# Patient Record
Sex: Male | Born: 1975
Health system: Southern US, Community
[De-identification: ages and names within clinical notes are randomized; demographics above are authoritative.]

## PROBLEM LIST (undated history)

## (undated) DIAGNOSIS — K219 Gastro-esophageal reflux disease without esophagitis: Secondary | ICD-10-CM

## (undated) DIAGNOSIS — J189 Pneumonia, unspecified organism: Secondary | ICD-10-CM

## (undated) DIAGNOSIS — M2550 Pain in unspecified joint: Secondary | ICD-10-CM

## (undated) DIAGNOSIS — E785 Hyperlipidemia, unspecified: Secondary | ICD-10-CM

## (undated) DIAGNOSIS — R51 Headache: Secondary | ICD-10-CM

## (undated) DIAGNOSIS — T7840XA Allergy, unspecified, initial encounter: Secondary | ICD-10-CM

## (undated) DIAGNOSIS — R200 Anesthesia of skin: Secondary | ICD-10-CM

## (undated) DIAGNOSIS — G4700 Insomnia, unspecified: Secondary | ICD-10-CM

## (undated) HISTORY — PX: OTHER SURGICAL HISTORY: SHX169

## (undated) HISTORY — DX: Allergy, unspecified, initial encounter: T78.40XA

## (undated) HISTORY — DX: Hyperlipidemia, unspecified: E78.5

---

## 2008-11-10 ENCOUNTER — Emergency Department (HOSPITAL_COMMUNITY): Admission: EM | Admit: 2008-11-10 | Discharge: 2008-11-10 | Payer: Self-pay | Admitting: Emergency Medicine

## 2009-04-27 ENCOUNTER — Encounter (INDEPENDENT_AMBULATORY_CARE_PROVIDER_SITE_OTHER): Payer: Self-pay | Admitting: Family Medicine

## 2009-04-27 ENCOUNTER — Inpatient Hospital Stay (HOSPITAL_COMMUNITY): Admission: EM | Admit: 2009-04-27 | Discharge: 2009-04-29 | Payer: Self-pay | Admitting: Emergency Medicine

## 2009-04-29 ENCOUNTER — Encounter (INDEPENDENT_AMBULATORY_CARE_PROVIDER_SITE_OTHER): Payer: Self-pay | Admitting: Family Medicine

## 2009-05-21 ENCOUNTER — Ambulatory Visit: Payer: Self-pay | Admitting: Internal Medicine

## 2009-05-21 ENCOUNTER — Encounter (INDEPENDENT_AMBULATORY_CARE_PROVIDER_SITE_OTHER): Payer: Self-pay | Admitting: Family Medicine

## 2009-05-21 DIAGNOSIS — J45909 Unspecified asthma, uncomplicated: Secondary | ICD-10-CM

## 2009-05-21 DIAGNOSIS — R062 Wheezing: Secondary | ICD-10-CM

## 2009-05-21 DIAGNOSIS — J069 Acute upper respiratory infection, unspecified: Secondary | ICD-10-CM | POA: Insufficient documentation

## 2009-05-21 DIAGNOSIS — J309 Allergic rhinitis, unspecified: Secondary | ICD-10-CM | POA: Insufficient documentation

## 2009-05-21 LAB — CONVERTED CEMR LAB
AST: 16 units/L (ref 0–37)
Albumin: 4.8 g/dL (ref 3.5–5.2)
Alkaline Phosphatase: 62 units/L (ref 39–117)
BUN: 9 mg/dL (ref 6–23)
Basophils Absolute: 0.1 10*3/uL (ref 0.0–0.1)
Creatinine, Ser: 0.86 mg/dL (ref 0.40–1.50)
Eosinophils Relative: 12 % — ABNORMAL HIGH (ref 0–5)
HCT: 50.5 % (ref 39.0–52.0)
Lymphocytes Relative: 27 % (ref 12–46)
Neutrophils Relative %: 53 % (ref 43–77)
Platelets: 249 10*3/uL (ref 150–400)
Potassium: 4.3 meq/L (ref 3.5–5.3)
RDW: 12.6 % (ref 11.5–15.5)
Rapid HIV Screen: NEGATIVE

## 2009-06-06 ENCOUNTER — Ambulatory Visit: Payer: Self-pay | Admitting: Pulmonary Disease

## 2009-06-06 DIAGNOSIS — E785 Hyperlipidemia, unspecified: Secondary | ICD-10-CM | POA: Insufficient documentation

## 2009-06-06 DIAGNOSIS — D721 Eosinophilia: Secondary | ICD-10-CM

## 2009-06-17 ENCOUNTER — Ambulatory Visit: Payer: Self-pay | Admitting: Family Medicine

## 2009-07-09 ENCOUNTER — Encounter (INDEPENDENT_AMBULATORY_CARE_PROVIDER_SITE_OTHER): Payer: Self-pay | Admitting: Family Medicine

## 2009-07-15 ENCOUNTER — Ambulatory Visit: Payer: Self-pay | Admitting: Internal Medicine

## 2009-07-22 ENCOUNTER — Ambulatory Visit (HOSPITAL_COMMUNITY): Admission: RE | Admit: 2009-07-22 | Discharge: 2009-07-22 | Payer: Self-pay | Admitting: Family Medicine

## 2009-08-05 ENCOUNTER — Ambulatory Visit: Payer: Self-pay | Admitting: Pulmonary Disease

## 2009-08-26 ENCOUNTER — Ambulatory Visit: Payer: Self-pay | Admitting: Pulmonary Disease

## 2009-08-27 DIAGNOSIS — J329 Chronic sinusitis, unspecified: Secondary | ICD-10-CM

## 2009-09-10 ENCOUNTER — Ambulatory Visit: Payer: Self-pay | Admitting: Internal Medicine

## 2009-10-01 ENCOUNTER — Ambulatory Visit: Payer: Self-pay | Admitting: Family Medicine

## 2009-11-06 ENCOUNTER — Ambulatory Visit: Payer: Self-pay | Admitting: Internal Medicine

## 2009-11-26 ENCOUNTER — Ambulatory Visit: Payer: Self-pay | Admitting: Internal Medicine

## 2009-12-21 ENCOUNTER — Emergency Department (HOSPITAL_COMMUNITY): Admission: EM | Admit: 2009-12-21 | Discharge: 2009-12-21 | Payer: Self-pay | Admitting: Emergency Medicine

## 2010-01-11 ENCOUNTER — Emergency Department (HOSPITAL_COMMUNITY): Admission: EM | Admit: 2010-01-11 | Discharge: 2010-01-11 | Payer: Self-pay | Admitting: Emergency Medicine

## 2010-01-31 ENCOUNTER — Ambulatory Visit: Payer: Self-pay | Admitting: Internal Medicine

## 2010-02-26 ENCOUNTER — Ambulatory Visit: Payer: Self-pay | Admitting: Pulmonary Disease

## 2010-03-11 ENCOUNTER — Telehealth (INDEPENDENT_AMBULATORY_CARE_PROVIDER_SITE_OTHER): Payer: Self-pay | Admitting: *Deleted

## 2010-03-11 LAB — CONVERTED CEMR LAB: IgE (Immunoglobulin E), Serum: 717.1 intl units/mL — ABNORMAL HIGH (ref 0.0–180.0)

## 2010-03-28 ENCOUNTER — Ambulatory Visit: Payer: Self-pay | Admitting: Pulmonary Disease

## 2010-04-14 ENCOUNTER — Ambulatory Visit: Payer: Self-pay | Admitting: Pulmonary Disease

## 2010-04-14 ENCOUNTER — Ambulatory Visit: Payer: Self-pay | Admitting: Internal Medicine

## 2010-05-09 ENCOUNTER — Ambulatory Visit: Payer: Self-pay | Admitting: Internal Medicine

## 2010-05-14 ENCOUNTER — Ambulatory Visit: Payer: Self-pay | Admitting: Internal Medicine

## 2010-05-18 ENCOUNTER — Encounter: Payer: Self-pay | Admitting: Internal Medicine

## 2010-05-21 ENCOUNTER — Ambulatory Visit: Payer: Self-pay | Admitting: Internal Medicine

## 2010-05-23 ENCOUNTER — Ambulatory Visit: Payer: Self-pay | Admitting: Internal Medicine

## 2010-05-26 ENCOUNTER — Ambulatory Visit: Payer: Self-pay | Admitting: Pulmonary Disease

## 2010-05-26 ENCOUNTER — Ambulatory Visit: Payer: Self-pay | Admitting: Internal Medicine

## 2010-06-03 ENCOUNTER — Ambulatory Visit: Payer: Self-pay | Admitting: Internal Medicine

## 2010-06-06 ENCOUNTER — Ambulatory Visit: Payer: Self-pay | Admitting: Internal Medicine

## 2010-06-10 ENCOUNTER — Ambulatory Visit: Payer: Self-pay | Admitting: Internal Medicine

## 2010-06-19 ENCOUNTER — Ambulatory Visit
Admission: RE | Admit: 2010-06-19 | Discharge: 2010-06-19 | Payer: Self-pay | Source: Home / Self Care | Attending: Internal Medicine | Admitting: Internal Medicine

## 2010-06-21 ENCOUNTER — Ambulatory Visit: Payer: Self-pay | Admitting: Internal Medicine

## 2010-06-23 ENCOUNTER — Ambulatory Visit: Payer: Self-pay | Admitting: Internal Medicine

## 2010-06-25 ENCOUNTER — Ambulatory Visit: Payer: Self-pay | Admitting: Internal Medicine

## 2010-06-26 ENCOUNTER — Ambulatory Visit: Payer: Self-pay | Admitting: Internal Medicine

## 2010-07-02 ENCOUNTER — Ambulatory Visit: Payer: Self-pay | Admitting: Internal Medicine

## 2010-07-03 ENCOUNTER — Ambulatory Visit: Payer: Self-pay | Admitting: Internal Medicine

## 2010-07-06 ENCOUNTER — Ambulatory Visit: Payer: Self-pay | Admitting: Internal Medicine

## 2010-07-08 ENCOUNTER — Ambulatory Visit: Payer: Self-pay | Admitting: Internal Medicine

## 2010-07-08 NOTE — Assessment & Plan Note (Signed)
Summary: allergyt problem/ mbw   Vital Signs:  Patient profile:   35 year old male Height:      67 inches Weight:      222.13 pounds BMI:     34.92 O2 Sat:      90 % on Room air Pulse rate:   113 / minute BP sitting:   144 / 84  (left arm) Cuff size:   regular  Vitals Entered By: Reynaldo Minium CMA (May 09, 2010 3:46 PM)  O2 Flow:  Room air CC: New patient-Allergy/Pulmonary   Copy to:  Syliva Overman Primary Provider/Referring Provider:  Syliva Overman  CC:  New patient-Allergy/Pulmonary.  History of Present Illness: May 09, 2010- 34 yoM  referred for allergy evaluation, coming today with his wife.He sees Dr Carolyne Fiscal for PCP and Dr Vassie Loll for pulmonary. Wheeze with dyspnea and sneeze have been noted back at least 5 years, to time he started current job on arrival in this area. It is unclear if symptoms relate to the job, or that was simply an anniversary marker. He did not improve with a 2 month leave of absence from the job. Denies childhood respiratory disease.Wheeze, sneeze, runny nose, watery eyes and itching in throat are worse in Spring and Fall. OTC antihistamines are not very helpful. Lab- Allergy Profile broadly elevated specific IgE w/ total IgE 1100.       - CT sinus 07/22/09- pansinusitis vs polyps       - CXR 04/14/10- mild bronchitis/ peribronchialthickening w/ resolution of pneumonia since 01/2010. 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 Quit cigs 2010.   Preventive Screening-Counseling & Management  Alcohol-Tobacco     Smoking Status: quit     Packs/Day: 0.5     Year Started: 1991     Year Quit: 2010     Passive Smoke Exposure: no  Current Medications (verified): 1)  Advair Diskus 500-50 Mcg/dose Aepb (Fluticasone-Salmeterol) .... Inhale 1 Puff Two Times A Day 2)  Singulair 10 Mg Tabs (Montelukast Sodium) .Marland Kitchen.. 1 By Mouth Daily 3)  Xyzal 5 Mg Tabs (Levocetirizine Dihydrochloride) .... Take 1  Tablet By Mouth Once A Day 4)  Aciphex 20 Mg Tbec (Rabeprazole Sodium) .... Once Daily 5)  Flonase 50 Mcg/act Susp (Fluticasone Propionate) .... Two Times A Day 6)  Sinus Rinse Kit  Pack (Hypertonic Nasal Wash) .... Every 2-3 Days 7)  Combivent 18-103 Mcg/act Aero (Ipratropium-Albuterol) .Marland Kitchen.. 1-2 Puffs Every 4-6 Hours As Needed 8)  Optivar 0.05 % Soln (Azelastine Hcl) .Marland Kitchen.. 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 Pt lives with long-time girlfriend.  pt has 3 children.  2001- daughter 57- son 54- son Former Smoker- started at age 20.  35 cigs per day.  quit July 2010. Alcohol use-no, quit 10/2008. 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 Pt lives with long-time girlfriend.  pt has 3 children.  2001- daughter 41- son 46- son Former Smoker- started at age 17.  85 cigs per day.  quit July 2010. Alcohol use-no, quit 10/2008. was a casual drinker Drug use-no Regular exercise-no  Review of Systems       The patient complains of shortness of breath with activity, shortness of breath at rest, productive cough,  non-productive cough, irregular heartbeats, abdominal pain, headaches, nasal congestion/difficulty breathing through nose, sneezing, itching, rash, and change in color of mucus.  The patient denies coughing up blood, chest pain, acid heartburn, indigestion, loss of appetite, weight change, difficulty swallowing, sore throat, tooth/dental problems, ear ache, anxiety, depression, hand/feet swelling, joint stiffness or pain, and fever.    Physical Exam  Additional Exam:  General: A/Ox3; pleasant and cooperative, NAD, SKIN: no rash, lesions, some red dermatosis on arms NODES: no lymphadenopathy HEENT: [place]/AT, EOM- WNL, Conjuctivae- clear, PERRLA, TM-WNL, Nose- turbinate edema, no polyps Throat- clear and wnl. Mallampati  III NECK: Supple w/ fair ROM, JVD- none, normal carotid impulses w/o bruits Thyroid- normal to palpation CHEST: Wheeze and cough, unlabored HEART: RRR, no m/g/r heard ABDOMEN: Soft and nl; nml bowel sounds; no organomegaly or masses noted WVP:XTGG, nl pulses, no edema  NEURO: Grossly intact to observation       Impression & Recommendations:  Problem # 1:  EOSINOPHILIA (ICD-288.3)  Significant asthma and rhinitis. Has had to miss work. We can put him on a prednisone maintenance and return for  allergy testing. We have discussed environmental triggers. House dust and local seasonal pollens seem likely to be important.   Problem # 2:  SINUSITIS, CHRONIC (ICD-473.9) He doesn't describe aspirin sensitivity. If he has nasal polyps, they aren't apparent annteriorly.   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  Medications Added to Medication List This Visit: 1)  Prednisone 10 Mg Tabs (Prednisone) .... 2 daily x 7 days, then one daily  Other Orders: Consultation Level IV (26948) Depo- Medrol 80mg  (J1040) Admin of Therapeutic Inj  intramuscular or subcutaneous (54627) Nebulizer Tx  (03500)  Patient Instructions: 1)  Return as able for allergy skin testing. Stop all antihistamines 3 days before skin testing, including cold and allergy meds, otc sleep and cough meds. This includes Xyzal 2)  Script for prednisone to stabilize you until we see what your allergy skin tests look like.  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)    Route: IM    Site: LUOQ gluteus    Exp Date: 10/06/2012    Lot #: [id number]    Mfr: Pharmacia    Patient tolerated injection without complications    Given by: Kandice Hams CMA (May 09, 2010 5:12 PM)  Medication # 1:    Medication: Xopenex 1.25mg     Diagnosis: ASTHMA, 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

## 2010-07-08 NOTE — Progress Notes (Signed)
Summary: returning call  Phone Note Call from Patient Call back at Home Phone [phone]   Caller: Patient Call For: alva Summary of Call: Returning Neshkoro R's call. Initial call taken by: Darletta Moll,  March 11, 2010 3:39 PM  Follow-up for Phone Call        pt informed. jwr

## 2010-07-08 NOTE — Assessment & Plan Note (Signed)
Summary: NP follow up - asthma   Copy to:  Syliva Overman Primary Provider/Referring Provider:  Syliva Overman  CC:  3 month follow up - states the advair is working well.  went to PCP last month bronchitic symptoms and treated w/ steroid taper.  no new complaints today.  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  05/2009--Initial pulmonary consult-  [place] 5 [place]rs., -CXR 11/20  wnl, onset x  2 yrs , no childhood h/oasthma, dust from appliances  He has observed that dust, illnesses and tobacco smoke are triggers for his shortness of breath. This is his 4th exacerbation in the last 2 years, previously 3 treated with by mouth steroids. Previously with Advair, ventolin and PO Abx and prednisone with last exacerbation. Peak flow on 12/14 was 300,  Not needed ventolin x 1 wk, last pred 12/16-19 Labs --> eosinophilia , otherwise nml, 1100 eos ([facility])  August 05, 2009--Presents for 2 month follow up - states began tightness in chest, prod cough with yellow mucus, ithcy eyes, sore throat x1week - denies f/c/s.  states began augmentin given by PCP 3days ago for sinus infection. 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 He is currently on 3 weeks of antibiotics. >> prednisone short course x 2 wks  August 26, 2009-- Wheezing has increased for last 1 week. Has clear nasal mucus.  No albuterol use x 2 months. Has more symptoms at night w/ cough, nasal draiange and wheeizng . On flovent & singulair. PF 350. On ABx for sinusitis  November 26, 2009--3 month follow up - states the advair is working well.  went to PCP last month bronchitic symptoms and treated w/ steroid taper.  no new complaints today. He continues to have some intermittent sinus congestion. Use saline rinses weekly. Rare albuterol use. Acitivity level is good , no nocturnal awakenings. Denies chest pain, dyspnea,  orthopnea, hemoptysis, fever, n/v/d, edema, headache.   Medications Prior to Update: 1)  Singulair 10 Mg Tabs (Montelukast Sodium) .Marland Kitchen.. 1 By Mouth Daily 2)  Flovent Hfa 220 Mcg/act Aero (Fluticasone Propionate  Hfa) .... Inhale 2 Puffs Two Times A Day 3)  Xyzal 5 Mg Tabs (Levocetirizine Dihydrochloride) .... Take 1 Tablet By Mouth Once A Day 4)  Aciphex 20 Mg Tbec (Rabeprazole Sodium) .... Once Daily 5)  Amoxicillin-Pot Clavulanate 250-62.5 Mg/37ml Susr (Amoxicillin-Pot Clavulanate) .... Three Times A Day 6)  Flonase 50 Mcg/act Susp (Fluticasone Propionate) .... Two Times A Day 7)  Sinus Rinse Neti Pot .... As Directed 8)  Ventolin Hfa 108 (90 Base) Mcg/act  Aers (Albuterol Sulfate) .Marland Kitchen.. 1-2 Puffs Every 4-6 Hours As Needed  Current Medications (verified): 1)  Ventolin Hfa 108 (90 Base) Mcg/act  Aers (Albuterol Sulfate) .Marland Kitchen.. 1-2 Puffs Every 4-6 Hours As Needed 2)  Advair Diskus 500-50 Mcg/dose Aepb (Fluticasone-Salmeterol) .... Inhale 1 Puff Two Times A Day 3)  Singulair 10 Mg Tabs (Montelukast Sodium) .Marland Kitchen.. 1 By Mouth Daily 4)  Xyzal 5 Mg Tabs (Levocetirizine Dihydrochloride) .... Take 1 Tablet By Mouth Once A Day 5)  Aciphex 20 Mg Tbec (Rabeprazole Sodium) .... Once Daily 6)  Flonase 50 Mcg/act Susp (Fluticasone Propionate) .... Two Times A Day 7)  Sinus Rinse Neti Pot .... 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  pt has 3 children.  2001- daughter 54- son 57- son Former Smoker- started at age 19.  46 cigs per day.  quit July 2010. Alcohol use-no, quit 10/2008. was a casual drinker Drug use-no Regular exercise-no  Risk Factors: Caffeine Use: 1 (05/21/2009) Exercise: no  (05/21/2009)  Risk Factors: Smoking Status: quit (05/21/2009) Passive Smoke Exposure: no (05/21/2009)  Review of Systems      See HPI  Vital Signs:  Patient profile:   35 year old male Height:      67 inches Weight:      223 pounds BMI:     35.05 O2 Sat:      99 % on Room air Temp:     98.1 degrees F oral Pulse rate:   80 / minute BP sitting:   128 / 68  (left arm) Cuff size:   regular  Vitals Entered By: Boone Master CNA/MA (November 26, 2009 10:07 AM)  O2 Flow:  Room air CC: 3 month follow up - states the advair is working well.  went to PCP last month bronchitic symptoms and treated w/ steroid taper.  no new complaints today Is Patient Diabetic? No Comments Medications reviewed with patient Daytime contact number verified with patient. Boone Master CNA/MA  November 26, 2009 10:07 AM    Physical Exam  Additional Exam:  GEN: A/Ox3; pleasant , NAD HEENT:  [place]/AT, , EACs-clear, TMs-wnl, NOSE-pale w/ clear discharge. , THROAT-clear NECK:  Supple w/ fair ROM; no JVD; normal carotid impulses w/o bruits; no thyromegaly or nodules palpated; no lymphadenopathy. RESP  Clear to P & A; w/o, wheezes/ rales/ or rhonchi. CARD:  RRR, no m/r/g   GI:   Soft & nt; nml bowel sounds; no organomegaly or masses detected. [place]: Warm bil,  no calf tenderness edema, clubbing, pulses intact Neuro:intact w/ no focal deficits noted.    Impression & Recommendations:  Problem # 1:  ASTHMA (ICD-493.90) Controlled on present regimen on return is still doing well consider decreasing Advair dose.   Problem # 2:  ALLERGIC RHINITIS (ICD-477.9)  add saline nasal rinses once dail we discussed possible ENT evaluation due to chronic symptoms on max meds however  he wants to wait for now-see how he does over the summer.   His updated medication list for this problem includes:    Xyzal 5 Mg Tabs (Levocetirizine dihydrochloride) .Marland Kitchen... Take 1 tablet by mouth once a day    Flonase 50 Mcg/act Susp (Fluticasone  propionate) .Marland Kitchen..Marland Kitchen Two times a day  Orders: Est. Patient Level II (60454)  Medications Added to Medication List This Visit: 1)  Advair Diskus 500-50 Mcg/dose Aepb (Fluticasone-salmeterol) .... Inhale 1 puff two times a day  Complete Medication List: 1)  Advair Diskus 500-50 Mcg/dose Aepb (Fluticasone-salmeterol) .... Inhale 1 puff two times a day 2)  Singulair 10 Mg Tabs (Montelukast sodium) .Marland Kitchen.. 1 by mouth daily 3)  Xyzal 5 Mg Tabs (Levocetirizine dihydrochloride) .... Take 1 tablet by mouth once a day 4)  Aciphex 20 Mg Tbec (Rabeprazole sodium) .... Once daily 5)  Flonase 50 Mcg/act Susp (Fluticasone propionate) .... Two times a day 6)  Sinus Rinse Neti Pot  .... As directed 7)  Ventolin Hfa 108 (90 Base) Mcg/act Aers (Albuterol sulfate) .Marland Kitchen.. 1-2 puffs every 4-6 hours as needed  Patient Instructions: 1)  Continue on same meds.  2)  Saline nasal rinses daily  3)  follow up Dr. Vassie Loll in 3 months and as needed

## 2010-07-08 NOTE — Assessment & Plan Note (Signed)
Summary: rov 3-4 wks ///kp   Copy to:  Syliva Overman Primary Provider/Referring Provider:  Syliva Overman  CC:  1 month follow up.  states breathing is doing good but states he is having a "little bit" of wheezing and prod cough in the mornings with yellow mucus.  denies chest tightness.  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  05/2009--Initial pulmonary consult-  [place] 5 [place]rs., -CXR 11/20  wnl, onset x  2 yrs , no childhood h/oasthma, dust from appliances  He has observed that dust, illnesses and tobacco smoke are triggers for his shortness of breath. This is his 4th exacerbation in the last 2 years, previously 3 treated with by mouth steroids. Previously with Advair, ventolin and PO Abx and prednisone with last exacerbation. Peak flow on 12/14 was 300,  Not needed ventolin x 1 wk, last pred 12/16-19 Labs --> eosinophilia , otherwise nml, 1100 eos (AEC)  August 05, 2009--Presents for 2 month follow up - states began tightness in chest, prod cough with yellow mucus, ithcy eyes, sore throat x1week - denies f/c/s.  states began augmentin given by PCP 3days ago for sinus infection. 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 He is currently on 3 weeks of antibiotics. >> prednisone short course x 2 wks  August 26, 2009 4:24 PM  Wheezing has increased for last 1 week. Has clear nasal mucus.  No albuterol use x 2 months. Has more symptoms at night w/ cough, nasal draiange and wheeizng . On flovent & singulair. PF 350. On ABx for sinusitis Denies chest pain,   orthopnea, hemoptysis, fever, n/v/d, edema, headache.   Current Medications (verified): 1)  Ventolin Hfa 108 (90 Base) Mcg/act  Aers (Albuterol Sulfate) .Marland Kitchen.. 1-2 Puffs Every 4-6 Hours As Needed 2)  Flovent Hfa 220 Mcg/act Aero (Fluticasone Propionate  Hfa) .... Inhale 2 Puffs Two Times A Day 3)   Singulair 10 Mg Tabs (Montelukast Sodium) .Marland Kitchen.. 1 By Mouth Daily 4)  Xyzal 5 Mg Tabs (Levocetirizine Dihydrochloride) .... Take 1 Tablet By Mouth Once A Day 5)  Amoxicillin-Pot Clavulanate 250-62.5 Mg/48ml Susr (Amoxicillin-Pot Clavulanate) .... Three Times A Day 6)  Aciphex 20 Mg Tbec (Rabeprazole Sodium) .... Once Daily 7)  Flonase 50 Mcg/act Susp (Fluticasone Propionate) .... Two Times A Day 8)  Sinus Rinse Neti Pot .... 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  pt has 3 children.  2001- daughter 88- son 75- son Former Smoker- started at age 89.  53 cigs per day.  quit July 2010. Alcohol use-no, quit 10/2008. was a casual drinker Drug use-no Regular exercise-no  Risk Factors: Smoking Status: quit (05/21/2009) Passive Smoke Exposure: no (05/21/2009)  Review of Systems       The patient complains of dyspnea on exertion.  The patient denies anorexia, fever, weight loss, weight gain, vision loss, decreased hearing, hoarseness, chest pain, syncope, peripheral edema, prolonged cough, headaches, hemoptysis, abdominal pain, melena, hematochezia, severe indigestion/heartburn, hematuria, muscle weakness, suspicious skin lesions, transient blindness, difficulty walking, depression, unusual weight change, and abnormal bleeding.    Vital Signs:  Patient profile:   35 year old male Height:  67 inches Weight:      222.38 pounds O2 Sat:      98 % on Room air Temp:     98.0 degrees F oral Pulse rate:   79 / minute BP sitting:   118 / 82  (right arm) Cuff size:   large  Vitals Entered By: Gweneth Dimitri RN (August 26, 2009 4:15 PM)  O2 Flow:  Room air CC: 1 month follow  up.  states breathing is doing good but states he is having a "little bit" of wheezing and prod cough in the mornings with yellow mucus.  denies chest tightness.   Comments Medications reviewed with patient Daytime contact number verified with patient. Gweneth Dimitri RN  August 26, 2009 4:15 PM    Physical Exam  Additional Exam:  Gen. Pleasant, well-nourished, in no distress, normal affect ENT - nasal mucosa pale, swollen, clear discharge.  Neck: No JVD, no thyromegaly, no carotid bruits Lungs: no rhonchi, clear Cardiovascular: Rhythm regular, heart sounds  normal, no murmurs or gallops, no peripheral edema Abdomen: soft and non-tender, no hepatosplenomegaly, BS normal. Neuro:  alert, non focal     Impression & Recommendations:  Problem # 1:  ASTHMA (ICD-493.90) Add LABA to flovent - hence will use advair 500/50 ct singulair PF monitoring- discussed Discussed plan for exacerbation.  Problem # 2:  SINUSITIS, CHRONIC (ICD-473.9) On extended ABx for this. Consider ENT consult if persistent  Avoid NSAIDs  Medications Added to Medication List This Visit: 1)  Amoxicillin-pot Clavulanate 250-62.5 Mg/69ml Susr (Amoxicillin-pot clavulanate) .... Three times a day 2)  Aciphex 20 Mg Tbec (Rabeprazole sodium) .... Once daily 3)  Flonase 50 Mcg/act Susp (Fluticasone propionate) .... Two times a day 4)  Sinus Rinse Neti Pot  .... As directed 5)  Advair Diskus 500-50 Mcg/dose Aepb (Fluticasone-salmeterol) .Marland Kitchen.. 1 puff two times a day  Other Orders: Est. Patient Level III (03474)  Patient Instructions: 1)  Copy sent to:Dr Carolyne Fiscal 2)  Please schedule a follow-up appointment in 3 months with TP 3)  Take advair two times a day instead of FLOVENT 4)  Peak flow meter Prescriptions: ADVAIR DISKUS 500-50 MCG/DOSE AEPB (FLUTICASONE-SALMETEROL) 1 puff two times a day  #1 x 2   Entered and Authorized by:   Comer Locket Vassie Loll MD   Signed by:   Comer Locket Vassie Loll MD on 08/26/2009   Method used:   Faxed to  ...       [facility] (retail)       [address] [place], [place]  [address]       Ph: [phone]       Fax: [phone]   RxID:   [id number]

## 2010-07-08 NOTE — Assessment & Plan Note (Signed)
Summary: NP follow up - asthma   Copy to:  Syliva Overman Primary Provider/Referring Provider:  Syliva Overman  CC:  2 month follow up - states began tightness in chest, prod cough with yellow mucus, ithcy eyes, and sore throat x1week - denies f/c/s.  states began augmentin given by PCP 3days ago for sinus infection.  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  05/2009--Initial pulmonary consult-  [place] 5 [place]rs., -CXR 11/20  wnl, onset x  2 yrs , no childhood h/oasthma, dust from appliances  He has observed that dust, illnesses and tobacco smoke are triggers for his shortness of breath. This is his 4th exacerbation in the last 2 years, previously 3 treated with by mouth steroids. Previously with Advair, ventolin and PO Abx and prednisone with last exacerbation. Peak flow on 12/14 was 300,  Not needed ventolin x 1 wk, last pred 12/16-19 Labs --> eosinophilia , otherwise nml, 1100 eos ([facility])  August 05, 2009--Presents for 2 month follow up - states began tightness in chest, prod cough with yellow mucus, ithcy eyes, sore throat x1week - denies f/c/s.  states began augmentin given by PCP 3days ago for sinus infection. 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 He is currently on 3 weeks of antibiotics.. Wheezing has increased for last 1 week. Has clear nasal mucus. Denies chest pain,   orthopnea, hemoptysis, fever, n/v/d, edema, headache.  No albuterol use x 2 months. Has more symptoms at night w/ cough, nasal draiange and wheeizng .   Medications Prior to Update: 1)  Ventolin Hfa 108 (90 Base) Mcg/act  Aers (Albuterol Sulfate) .Marland Kitchen.. 1-2 Puffs Every 4-6 Hours As Needed 2)  Flovent Hfa 110 Mcg/act Aero (Fluticasone Propionate  Hfa) .... 2 Puffs By Mouth Bid 3)  Clarinex 5 Mg Tabs (Desloratadine) .Marland Kitchen.. 1 By Mouth Daily 4)  Singulair 10 Mg Tabs (Montelukast Sodium)  .Marland Kitchen.. 1 By Mouth Daily 5)  Prilosec 20 Mg Cpdr (Omeprazole) .Marland Kitchen.. 1 By Mouth Daily X 28 Days  Current Medications (verified): 1)  Ventolin Hfa 108 (90 Base) Mcg/act  Aers (Albuterol Sulfate) .Marland Kitchen.. 1-2 Puffs Every 4-6 Hours As Needed 2)  Flovent Hfa 220 Mcg/act Aero (Fluticasone Propionate  Hfa) .... Inhale 2 Puffs Two Times A Day 3)  Singulair 10 Mg Tabs (Montelukast Sodium) .Marland Kitchen.. 1 By Mouth Daily 4)  Xyzal 5 Mg Tabs (Levocetirizine Dihydrochloride) .... 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  pt has 3 children.  2001- daughter 52- son 53- son Former Smoker- started at age 39.  43 cigs per day.  quit July 2010. Alcohol use-no, quit 10/2008. was a casual drinker Drug use-no Regular exercise-no  Risk Factors: Caffeine Use: 1 (05/21/2009) Exercise: no (05/21/2009)  Risk Factors: Smoking Status: quit (05/21/2009) Passive Smoke Exposure: no (05/21/2009)  Review of Systems      See HPI  Vital Signs:  Patient profile:   35 year old male  Height:      67 inches Weight:      219.50 pounds BMI:     34.50 O2 Sat:      98 % on Room air Temp:     97.1 degrees F oral Pulse rate:   62 / minute BP sitting:   114 / 68  (left arm) Cuff size:   large  Vitals Entered By: Boone Master CNA (August 05, 2009 9:41 AM)  O2 Flow:  Room air CC: 2 month follow up - states began tightness in chest, prod cough with yellow mucus, ithcy eyes,  sore throat x1week - denies f/c/s.  states began augmentin given by PCP 3days ago for sinus infection Is Patient Diabetic? No Comments Medications reviewed with patient Daytime contact number verified with patient. Boone Master CNA  August 05, 2009 9:41 AM    Physical Exam  Additional Exam:  Gen. Pleasant, well-nourished, in no distress, normal affect ENT - nasal mucosa pale, swollen, clear discharge.  Neck: No JVD, no thyromegaly, no carotid bruits Lungs: exp wheezing.  Cardiovascular: Rhythm regular, heart sounds  normal, no murmurs or gallops, no peripheral edema Abdomen: soft and non-tender, no hepatosplenomegaly, BS normal. Musculoskeletal: No deformities, no cyanosis or clubbing Neuro:  alert, non focal     Impression & Recommendations:  Problem # 1:  ASTHMA (ICD-493.90) Flare w/ assoicated sinusitis.  REC:  CT showed signif. sinus dz. may need ENT referral on return if not improving. Also prev high IGE may need to have RAST w/up.  Prednisone taper over next week.  Finish Augmentin as directed.  Mucinex DM two times a day as needed cough/congestion  Saline nasal rinses two times a day  follow up Dr. Vassie Loll in 3-4 weeks.  Please contact office for sooner follow up if symptoms do not improve or worsen   Medications Added to Medication List This Visit: 1)  Flovent Hfa 220 Mcg/act Aero (Fluticasone propionate  hfa) .... Inhale 2 puffs two times a day 2)  Xyzal 5 Mg Tabs (Levocetirizine dihydrochloride) .... Take 1 tablet by mouth once a day 3)  Prednisone 10 Mg Tabs (Prednisone) .... 4 tabs for 2 days, then 3 tabs for 2 days, 2 tabs for 2 days, then 1 tab for 2 days, then stop  Complete Medication List: 1)  Ventolin Hfa 108 (90 Base) Mcg/act Aers (Albuterol sulfate) .Marland Kitchen.. 1-2 puffs every 4-6 hours as needed 2)  Flovent Hfa 220 Mcg/act Aero (Fluticasone propionate  hfa) .... Inhale 2 puffs two times a day 3)  Singulair 10 Mg Tabs (Montelukast sodium) .Marland Kitchen.. 1 by mouth daily 4)   Xyzal 5 Mg Tabs (Levocetirizine dihydrochloride) .... Take 1 tablet by mouth once a day 5)  Prednisone 10 Mg Tabs (Prednisone) .... 4 tabs for 2 days, then 3 tabs for 2 days, 2 tabs for 2 days, then 1 tab for 2 days, then stop  Other Orders: Est. Patient Level IV (16109)  Patient Instructions: 1)  Prednisone taper over next week.  2)  Finish Augmentin as directed.  3)  Mucinex DM two times a day as needed cough/congestion  4)  Saline nasal rinses two times a day  5)  follow up Dr. Vassie Loll in 3-4 weeks.  6)  Please contact office for sooner follow up if symptoms do not improve or worsen  Prescriptions: PREDNISONE 10 MG TABS (PREDNISONE) 4 tabs for 2 days, then 3 tabs for 2 days, 2 tabs for 2 days, then 1 tab for 2 days, then stop  #  20 x 0   Entered and Authorized by:   Rubye Oaks NP   Signed by:   Keaden Gunnoe NP on 08/05/2009   Method used:   Faxed to ...       [facility] - [facility] (retail)       [address] [place], [place]  [address]       Ph: [phone]       Fax: [phone]   RxID:   [id number]   Appended Document: neb documentation    Clinical Lists Changes  Orders: Added new Service order of Nebulizer Tx (10272) - Signed

## 2010-07-08 NOTE — Letter (Signed)
Summary: Discharge Summary  Discharge Summary   Imported By: Arta Bruce 07/18/2009 14:18:15  _____________________________________________________________________  External Attachment:    Type:   Image     Comment:   External Document

## 2010-07-08 NOTE — Assessment & Plan Note (Signed)
Summary: 3 months/apc   Visit Type:  Follow-up Copy to:  Syliva Overman Primary Lynessa Almanzar/Referring Harper Vandervoort:  Syliva Overman  CC:  Asthma follow-up...patient finished a taper on Prednisone 2 weeks ago for wheezing and inflammation...breathing is doing good today...occasional cough with green mucus in the mornings...mucus clears throughout the day...pt requests flu shot today.  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 05/2009--Initial pulmonary consult-  [place] 5 [place]rs[place] -CXR 11/20  wnl, onset x  2 yrs , no childhood h/oasthma, dust from appliances  He has observed that dust, illnesses and tobacco smoke are triggers for his shortness of breath. 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    February 26, 2010 10:21 AM  remains poorly controlled , missing 1 week every month  ER visit last month, took prednisone from [facility]  He continues to have some intermittent sinus congestion. Use saline rinses weekly. Rare albuterol use. Claims compliance with meds - [facility] may chnage advair to symbicort. no nocturnal awakenings. Denies chest pain, dyspnea, orthopnea, hemoptysis, fever, n/v/d, edema, headache.   Avoid ASA/aleve/motrin Take tylenol for headaches  Asthma History    Asthma Control Assessment:    Age range: 12+ years    Symptoms: 0-2 days/week    Nighttime Awakenings: 0-2/month    Interferes w/ normal activity: some limitations    SABA use (not for EIB): 0-2 days/week    FEV1: 3.43 liters (today)    FEV1 Pred: 4.00 liters (today)    Exacerbations requiring oral systemic steroids: 2 or more/year    Asthma Control Assessment: Not Well Controlled    Current Medications (verified): 1)  Advair Diskus 500-50 Mcg/dose Aepb (Fluticasone-Salmeterol)  .... Inhale 1 Puff Two Times A Day 2)  Singulair 10 Mg Tabs (Montelukast Sodium) .Marland Kitchen.. 1 By Mouth Daily 3)  Xyzal 5 Mg Tabs (Levocetirizine Dihydrochloride) .... Take 1 Tablet By Mouth Once A Day 4)  Aciphex 20 Mg Tbec (Rabeprazole Sodium) .... Once Daily 5)  Flonase 50 Mcg/act Susp (Fluticasone Propionate) .... Two Times A Day 6)  Sinus Rinse Neti Pot .... As Directed 7)  Combivent 18-103 Mcg/act Aero (Ipratropium-Albuterol) .Marland Kitchen.. 1-2 Puffs Every 4-6 Hours As Needed 8)  Optivar 0.05 % Soln (Azelastine Hcl) .Marland Kitchen.. 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  pt has 3 children.  2001- daughter 17- son 29- son Former Smoker- started at age 35.  33 cigs per day.  quit July 2010. Alcohol use-no, quit 10/2008. was 35 casual drinker Drug use-no Regular exercise-no  Review of Systems       The patient complains of dyspnea on exertion.  The patient denies anorexia, fever, weight loss, weight gain, vision loss, decreased hearing, hoarseness, chest pain, syncope, peripheral edema, prolonged cough, headaches, hemoptysis, abdominal pain, melena, hematochezia, severe indigestion/heartburn, hematuria, muscle weakness, suspicious skin lesions, difficulty walking, depression, unusual weight change, abnormal bleeding, enlarged lymph nodes, and angioedema.    Vital Signs:  Patient profile:   35 year old male Height:      67 inches (170.18 cm) Weight:  223 pounds (101.36 kg) BMI:     35.05 O2 Sat:      98 % on Room air Temp:     98.0 degrees F (36.67 degrees C) oral Pulse rate:   68 / minute BP sitting:   120 / 78  (left arm) Cuff size:   large  Vitals Entered By: Michel Bickers CMA (February 26, 2010 9:57 AM)  O2 Sat at Rest %:  98 O2 Flow:  Room air CC: Asthma follow-up...patient finished a taper on Prednisone 2 weeks ago for wheezing and inflammation...breathing is doing good today...occasional cough with green mucus in the mornings...mucus clears throughout the day...pt requests flu shot today Comments Medications reviewed with patient Daytime phone verified with the patient. Michel Bickers CMA  February 26, 2010 9:58 AM   Physical Exam  Additional Exam:  GEN: A/Ox3; pleasant , NAD HEENT:  [place]/AT, , EACs-clear, TMs-wnl, NOSE-pale w/ clear discharge. , THROAT-clear NECK:  Supple w/ fair ROM; no JVD; normal carotid impulses w/o bruits; no thyromegaly or nodules palpated; no lymphadenopathy. RESP  Clear to P & A; w/o, wheezes/ rales/ or rhonchi. CARD:  RRR, no m/r/g   Musco: Warm bil,  no calf tenderness edema, clubbing, pulses intact    Pre-Spirometry FEV1    Value: 3.43 L     Pred: 4.00 L     Impression & Recommendations:  Problem # 1:  ASTHMA (ICD-493.90) Poorly controlled Would not change to symbicort - advair 500/50 would provide higher dose inhaled steroid. Discussed plan for exacerbation incl. self administered taper of prednisone- RX sent.  Problem # 2:  SINUSITIS, CHRONIC (ICD-473.9) ENt evaluation Avoid ASA/aleve/motrin Take tylenol for headaches  Medications Added to Medication List This Visit: 1)  Combivent 18-103 Mcg/act Aero (Ipratropium-albuterol) .Marland Kitchen.. 1-2 puffs every 4-6 hours as needed 2)  Optivar 0.05 % Soln (Azelastine hcl) .Marland Kitchen.. 1 drop in each eye daily 3)  Prednisone 10 Mg Tabs (Prednisone) .... 2 tabs once daily as directed  Other Orders: Est. Patient Level III (16109) Prescription Created Electronically [phone]) ENT Referral (ENT) T-"RAST" (Allergy Full Profile) IGE [phone]) Est. Patient Level IV (29562) Prescription Created Electronically [phone])  Asthma Management Plan    Asthma Severity: Moderate  Persistent    Control Assessment: Not Well Controlled    Predicted PEF: 629 liters/minute    Working PEF: 629 liters/minute    Plan based on PEF formula: [place] and [employer] Zone: Call your physician for shortness of breath.  Start Prednisone Taper.    Patient Instructions: 1)  Copy sent to: [facility] 2)  Please schedule a follow-up appointment in 1 month with TP 3)  Prednisone Rx sent to pharmacy - start taking if symptoms worse as we discussed 4)  It is recommended that you schedule a consultation with an ENT specialist: 5)  Blood work today for allergy testing 6)  Avoid ASA/aleve/motrin 7)  Take tylenol for headaches Prescriptions: PREDNISONE 10 MG TABS (PREDNISONE) 2 tabs once daily as directed  #20 x 1   Entered and Authorized by:   Comer Locket Vassie Loll MD   Signed by:   Comer Locket Vassie Loll MD on 02/26/2010   Method used:   Printed then faxed to ...       [facility] - [facility] (retail)       [address] [place], [place]  [address]       Ph: [phone]       Fax: [phone]  RxID:   [id number]   Appended Document: Orders Update    Clinical Lists Changes  Orders: Added new Service order of Flu Vaccine 14yrs + [phone]) - Signed Added new Service order of Admin 1st Vaccine (95621) - Signed Observations: Added new observation of FLU VAX#1VIS: 12/31/09 version given February 26, 2010. (02/26/2010 10:59) Added new observation of FLU VAXLOT: [id number] (02/26/2010 10:59) Added new observation of FLU VAX EXP: 12/06/2010 (02/26/2010 10:59) Added new observation of FLU VAXBY: Zackery Barefoot CMA (02/26/2010 10:59) Added new observation of FLU VAXRTE: IM (02/26/2010 10:59) Added new observation of FLU VAX DSE: 0.5 ml (02/26/2010 10:59) Added new observation of FLU VAXMFR: GlaxoSmithKline (02/26/2010 10:59) Added new observation of FLU VAX SITE: left deltoid (02/26/2010 10:59) Added new observation of FLU VAX: Fluvax 3+ (02/26/2010  10:59)       Immunizations Administered:  Influenza Vaccine # 1:    Vaccine Type: Fluvax 3+    Site: left deltoid    Mfr: GlaxoSmithKline    Dose: 0.5 ml    Route: IM    Given by: Zackery Barefoot CMA    Exp. Date: 12/06/2010    Lot #: [id number]    VIS given: 12/31/09 version given February 26, 2010.  Flu Vaccine Consent Questions:    Do you have a history of severe allergic reactions to this vaccine? no    Any prior history of allergic reactions to egg and/or gelatin? no    Do you have a sensitivity to the preservative Thimersol? no    Do you have a past history of Guillan-Barre Syndrome? no    Do you currently have an acute febrile illness? no    Have you ever had a severe reaction to latex? no    Vaccine information given and explained to patient? yes  Faxed pt's prednisone 10mg  to [phone]. Zackery Barefoot CMA  February 26, 2010 11:02 AM

## 2010-07-08 NOTE — Assessment & Plan Note (Signed)
Summary: 1 month/apc   Visit Type:  NP Asthma follow up Copy to:  Syliva Overman Primary Provider/Referring Provider:  Syliva Overman  CC:  Pt states breathing is better.  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 05/2009--Initial pulmonary consult-  [place] 5 [place]rs., -CXR 11/20  wnl, onset x  2 yrs , no childhood h/oasthma, dust from appliances  He has observed that dust, illnesses and tobacco smoke are triggers for his shortness of breath. 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    February 26, 2010  remains poorly controlled , missing 1 week every month  ER visit last month, took prednisone from [facility]  He continues to have some intermittent sinus congestion. Use saline rinses weekly. Rare albuterol use. Claims compliance with meds - [facility] may chnage advair to symbicort. no nocturnal awakenings. Denies chest pain, dyspnea, orthopnea, hemoptysis, fever, n/v/d, edema, headache.  Avoid ASA/aleve/motrin Take tylenol for headaches  March 28, 2010--Presents for follow up. Pt states breathing is better, denies SOB.  Last ov w/ asthma flare tx w/ steroids. Referrred to ENT and was seen on 9/26. No records availble. He says he was treated with steroids -finished 1 week ago. Also changed over to  nasonex. He has a follow up With ENT  next week. Says he is considering sinus surgery. Allergy profile showed high IGE  ~700 with positive rast test. We discussed several options and decided to refer to allergist for evaluation. Denies chest pain, orthopnea, hemoptysis, fever, n/v/d, edema, headache.   Preventive Screening-Counseling & Management  Alcohol-Tobacco     Smoking Status: quit     Packs/Day: 0.5     Year Started: 1991     Year Quit:  2010  Medications Prior to Update: 1)  Advair Diskus 500-50 Mcg/dose Aepb (Fluticasone-Salmeterol) .... Inhale 1 Puff Two Times A Day 2)  Singulair 10 Mg Tabs (Montelukast Sodium) .Marland Kitchen.. 1 By Mouth Daily 3)  Xyzal 5 Mg Tabs (Levocetirizine Dihydrochloride) .... Take 1 Tablet By Mouth Once A Day 4)  Aciphex 20 Mg Tbec (Rabeprazole Sodium) .... Once Daily 5)  Flonase 50 Mcg/act Susp (Fluticasone Propionate) .... Two Times A Day 6)  Sinus Rinse Neti Pot .... As Directed 7)  Combivent 18-103 Mcg/act Aero (Ipratropium-Albuterol) .Marland Kitchen.. 1-2 Puffs Every 4-6 Hours As Needed 8)  Optivar 0.05 % Soln (Azelastine Hcl) .Marland Kitchen.. 1 Drop in Each Eye Daily  Current Medications (verified): 1)  Advair Diskus 500-50 Mcg/dose Aepb (Fluticasone-Salmeterol) .... Inhale 1 Puff Two Times A Day 2)  Singulair 10 Mg Tabs (Montelukast Sodium) .Marland Kitchen.. 1 By Mouth Daily 3)  Xyzal 5 Mg Tabs (Levocetirizine Dihydrochloride) .... Take 1 Tablet By Mouth Once A Day 4)  Aciphex 20 Mg Tbec (Rabeprazole Sodium) .... Once Daily 5)  Flonase 50 Mcg/act Susp (Fluticasone Propionate) .... Two Times A Day 6)  Sinus Rinse Kit  Pack (Hypertonic Nasal Wash) .... Every 2-3 Days 7)  Combivent 18-103 Mcg/act Aero (Ipratropium-Albuterol) .Marland Kitchen.. 1-2 Puffs Every 4-6 Hours As Needed 8)  Optivar 0.05 % Soln (Azelastine Hcl) .Marland Kitchen.. 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  2001- daughter 88-  son 62- son Former Smoker- started at age 81.  71 cigs per day.  quit July 2010. Alcohol use-no, quit 10/2008. was a casual drinker Drug use-no Regular exercise-no  Social History: Packs/Day:  0.5  Review of Systems      See HPI  Vital Signs:  Patient profile:   35 year old male Height:      67 inches Weight:      225 pounds BMI:     35.37 O2 Sat:      98 % on Room air Temp:     96.8 degrees F oral Pulse rate:   62 / minute BP sitting:   130 / 82  (right arm) Cuff size:   large  Vitals Entered By: Zackery Barefoot CMA (March 28, 2010 9:34 AM)  O2 Flow:  Room air CC: Pt states breathing is better Is Patient Diabetic? No Comments Medications reviewed with patient  Verified contact number and pharmacy with patient Zackery Barefoot CMA  March 28, 2010 9:34 AM    Physical Exam  Additional Exam:  GEN: A/Ox3; pleasant , NAD HEENT:  [place]/AT, , EACs-clear, TMs-wnl, NOSE-pale w/ clear discharge. , THROAT-clear NECK:  Supple w/ fair ROM; no JVD; normal carotid impulses w/o bruits; no thyromegaly or nodules palpated; no lymphadenopathy. RESP  Clear to P & A; w/o, wheezes/ rales/ or rhonchi. CARD:  RRR, no m/r/g   Musco: Warm bil,  no calf tenderness edema, clubbing, pulses intact    Impression & Recommendations:  Problem # 1:  ASTHMA (ICD-493.90) currently compensated however frequent flare w/ assoicated sinusitis -w/ freq. steroid burst cont on same rx   Problem # 2:  SINUSITIS, CHRONIC (ICD-473.9) cont follow up with ENT.   Problem # 3:  ALLERGIC RHINITIS (ICD-477.9)  Continue on present regimen.  consider referral to allergy --high IGE, postive rast.  His updated medication list for this problem includes:    Xyzal 5 Mg Tabs (Levocetirizine dihydrochloride) .Marland Kitchen... Take 1 tablet by mouth once a day    Flonase 50 Mcg/act Susp (Fluticasone propionate) .Marland Kitchen..Marland Kitchen Two times a day    Sinus Rinse Kit Pack (Hypertonic nasal wash) ..... Every 2-3 days  Orders: Est. Patient  Level III (16109)  Medications Added to Medication List This Visit: 1)  Sinus Rinse Kit Pack (Hypertonic nasal wash) .... Every 2-3 days  Complete Medication List: 1)  Advair Diskus 500-50 Mcg/dose Aepb (Fluticasone-salmeterol) .... Inhale 1 puff two times a day 2)  Singulair 10 Mg Tabs (Montelukast sodium) .Marland Kitchen.. 1 by mouth daily 3)  Xyzal 5 Mg Tabs (Levocetirizine dihydrochloride) .... Take 1 tablet by mouth once a day 4)  Aciphex 20 Mg Tbec (Rabeprazole sodium) .... Once daily 5)  Flonase 50 Mcg/act Susp (Fluticasone propionate) .... Two times a day 6)  Sinus Rinse Kit Pack (Hypertonic nasal wash) .... Every 2-3 days 7)  Combivent 18-103 Mcg/act Aero (Ipratropium-albuterol) .Marland Kitchen.. 1-2 puffs every 4-6 hours as needed 8)  Optivar 0.05 % Soln (Azelastine hcl) .Marland Kitchen.. 1 drop in each eye daily  Patient Instructions: 1)  We are setting you up for evaluation with our allergy specialist-Dr. Maple Hudson.  2)  Continue on same meds.  3)  follow up  Dr. Vassie Loll in 6-8 weeks and as needed

## 2010-07-08 NOTE — Assessment & Plan Note (Signed)
Summary: Acute NP office visit - bronchitis   Copy to:  Syliva Overman Primary Provider/Referring Provider:  Syliva Overman  CC:  increased SOB, wheezing, and prod cough with green mucus x4days - denies f/c/s.  states symptoms occur approx every month.  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 05/2009--Initial pulmonary consult-  in [place] x 5 yrs., -CXR 11/20  wnl, onset x  2 yrs , no childhood h/oasthma, dust from appliances  He has observed that dust, illnesses and tobacco smoke are triggers for his shortness of breath. 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    February 26, 2010  remains poorly controlled , missing 1 week every month  ER visit last month, took prednisone from [facility]  He continues to have some intermittent sinus congestion. Use saline rinses weekly. Rare albuterol use. Claims compliance with meds - [facility] may chnage advair to symbicort. no nocturnal awakenings.  Avoid ASA/aleve/motrin Take tylenol for headaches  March 28, 2010--Presents for follow up. Pt states breathing is better, denies SOB.  Last ov w/ asthma flare tx w/ steroids. Referrred to ENT and was seen on 9/26. No records availble. He says he was treated with steroids -finished 1 week ago. Also changed over to  nasonex. He has a follow up With ENT  next week. Says he is considering sinus surgery. Allergy profile showed high IGE  ~700 with positive rast test. We discussed several options and decided to refer to allergist for evaluation.   April 14, 2010 --Presents for an acute office visit. Complains of increased SOB, wheezing, prod cough with green mucus x4days. Was doing well until 4 days ago when cough started w/ thick green mucus. Mother in law w/ cold like  symptoms. He has upcoming Allergy ov in 1 month with Dr. Maple Hudson. Says he has not missed any of his maintenence meds. Has increased his rescue inhaler use. Today on arrival to exam room sat did drop to 88% with quick revound to 91%. Xopenex neb given in office w/ O2 sat 94-95%. Denies chest pain,  orthopnea, hemoptysis, fever, n/v/d.   Medications Prior to Update: 1)  Advair Diskus 500-50 Mcg/dose Aepb (Fluticasone-Salmeterol) .... Inhale 1 Puff Two Times A Day 2)  Singulair 10 Mg Tabs (Montelukast Sodium) .Marland Kitchen.. 1 By Mouth Daily 3)  Xyzal 5 Mg Tabs (Levocetirizine Dihydrochloride) .... Take 1 Tablet By Mouth Once A Day 4)  Aciphex 20 Mg Tbec (Rabeprazole Sodium) .... Once Daily 5)  Flonase 50 Mcg/act Susp (Fluticasone Propionate) .... Two Times A Day 6)  Sinus Rinse Kit  Pack (Hypertonic Nasal Wash) .... Every 2-3 Days 7)  Combivent 18-103 Mcg/act Aero (Ipratropium-Albuterol) .Marland Kitchen.. 1-2 Puffs Every 4-6 Hours As Needed 8)  Optivar 0.05 % Soln (Azelastine Hcl) .Marland Kitchen.. 1 Drop in Each Eye Daily  Current Medications (verified): 1)  Advair Diskus 500-50 Mcg/dose Aepb (Fluticasone-Salmeterol) .... Inhale 1 Puff Two Times A Day 2)  Singulair 10 Mg Tabs (Montelukast Sodium) .Marland Kitchen.. 1 By Mouth Daily 3)  Xyzal 5 Mg Tabs (Levocetirizine Dihydrochloride) .... Take 1 Tablet By Mouth Once A Day 4)  Aciphex 20 Mg Tbec (Rabeprazole Sodium) .... Once Daily 5)  Flonase 50 Mcg/act Susp (Fluticasone Propionate) .... Two Times A Day 6)  Sinus Rinse Kit  Pack (Hypertonic Nasal Wash) .... Every  2-3 Days 7)  Combivent 18-103 Mcg/act Aero (Ipratropium-Albuterol) .Marland Kitchen.. 1-2 Puffs Every 4-6 Hours As Needed 8)  Optivar 0.05 % Soln (Azelastine Hcl) .Marland Kitchen.. 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  pt has 3 children.  2001- daughter 23- son 30- son Former Smoker- started at age 18.  39 cigs per day.  quit July 2010. Alcohol use-no, quit 10/2008. was a casual drinker Drug use-no Regular exercise-no  Risk Factors: Caffeine Use: 1 (05/21/2009) Exercise: no (05/21/2009)  Risk Factors: Smoking Status: quit (03/28/2010) Packs/Day: 0.5 (03/28/2010) Passive Smoke Exposure: no (05/21/2009)  Past Medical History: Current Problems:  HYPERLIPIDEMIA (ICD-272.4) ALLERGIC RHINITIS (ICD-477.9) ASTHMA (ICD-493.90) --High IGE, positive RAST>>refer to Allergy 10/11>>     Review of Systems      See HPI  Vital Signs:  Patient profile:   35 year old male Height:      67 inches Weight:      221.25 pounds BMI:     34.78 O2 Sat:      91 % on Room air Temp:     98.5 degrees F oral Pulse rate:   98 / minute BP sitting:   120 / 76  (left arm) Cuff size:   large  Vitals Entered By: Boone Master CNA/MA (April 14, 2010 10:41 AM)  O2 Flow:  Room air CC: increased SOB, wheezing, prod cough with green mucus x4days - denies f/c/s.  states symptoms occur approx every month Is Patient Diabetic? No Comments Medications reviewed with patient Daytime contact number verified with patient. Boone Master CNA/MA  April 14, 2010 10:42 AM    Physical Exam  Additional Exam:  GEN: A/Ox3; pleasant , NAD HEENT:  [place]/AT, , EACs-clear, TMs-wnl, NOSE-clear, THROAT-clear NECK:  Supple w/ fair ROM; no JVD; normal carotid impulses w/o bruits; no thyromegaly or nodules palpated; no lymphadenopathy. RESP  Coarse BS w/ scattered exp wheezing, improved aeration s/p neb tx. no stridor to accessory muscle use.  CARD:  RRR, no m/r/g   GI:   Soft & nt; nml bowel sounds; no organomegaly or masses detected. Musco: Warm bil,  no calf tenderness  edema, clubbing, pulses intact Neuro:i: intact w/ no focal deficitis noted.    Impression & Recommendations:  Problem # 1:  ASTHMA (ICD-493.90) Recurrent exacerbation w/ chronic sinus dz.  Xray today w/ no acute changes.  Plan:  Omnicef 300mg  two times a day for 10 days  Mucienx DM  two times a day as needed cough/congestion Steroid taper over next week.  Continue all other meds as recommended.  follow up Dr. Maple Hudson in 4 weeks and as needed  follow up Dr. Vassie Loll in 6 weeks.  Please contact office for sooner follow up if symptoms do not improve or worsen   Medications Added to Medication List This Visit: 1)  Cefdinir 300 Mg Caps (Cefdinir) .Marland Kitchen.. 1 by mouth two times a day 2)  Prednisone 10 Mg Tabs (Prednisone) .... 4 tabs for 2 days, then 3 tabs for 2 days, 2 tabs for 2 days, then 1 tab for 2 days, then stop  Complete Medication List: 1)  Advair Diskus 500-50 Mcg/dose Aepb (Fluticasone-salmeterol) .Marland KitchenMarland KitchenMarland Kitchen  Inhale 1 puff two times a day 2)  Singulair 10 Mg Tabs (Montelukast sodium) .Marland Kitchen.. 1 by mouth daily 3)  Xyzal 5 Mg Tabs (Levocetirizine dihydrochloride) .... Take 1 tablet by mouth once a day 4)  Aciphex 20 Mg Tbec (Rabeprazole sodium) .... Once daily 5)  Flonase 50 Mcg/act Susp (Fluticasone propionate) .... Two times a day 6)  Sinus Rinse Kit Pack (Hypertonic nasal wash) .... Every 2-3 days 7)  Combivent 18-103 Mcg/act Aero (Ipratropium-albuterol) .Marland Kitchen.. 1-2 puffs every 4-6 hours as needed 8)  Optivar 0.05 % Soln (Azelastine hcl) .Marland Kitchen.. 1 drop in each eye daily 9)  Cefdinir 300 Mg Caps (Cefdinir) .Marland Kitchen.. 1 by mouth two times a day 10)  Prednisone 10 Mg Tabs (Prednisone) .... 4 tabs for 2 days, then 3 tabs for 2 days, 2 tabs for 2 days, then 1 tab for 2 days, then stop  Other Orders: Nebulizer Tx (91478) T-2 View CXR (71020TC) Est. Patient Level IV (29562)  Patient Instructions: 1)  Omnicef 300mg  two times a day for 10 days  2)  Mucienx DM  two times a day as needed cough/congestion 3)   Steroid taper over next week.  4)  Continue all other meds as recommended.  5)  follow up Dr. Maple Hudson in 4 weeks and as needed  6)  follow up Dr. Vassie Loll in 6 weeks.  7)  Please contact office for sooner follow up if symptoms do not improve or worsen  Prescriptions: PREDNISONE 10 MG TABS (PREDNISONE) 4 tabs for 2 days, then 3 tabs for 2 days, 2 tabs for 2 days, then 1 tab for 2 days, then stop  #20 x 0   Entered and Authorized by:   Rubye Oaks NP   Signed by:   Tammy Parrett NP on 04/14/2010   Method used:   Faxed to ...       [facility] (retail)       [address] [place], [place]  [address]       Ph: [phone]       Fax: [phone]   RxID:   [id number] CEFDINIR 300 MG CAPS (CEFDINIR) 1 by mouth two times a day  #20 x 0   Entered and Authorized by:   Rubye Oaks NP   Signed by:   Tammy Parrett NP on 04/14/2010   Method used:   Faxed to ...       [facility] - [facility] (retail)       [address] [place], [place]  [address]       Ph: [phone]       Fax: [phone]   RxID:   [id number]

## 2010-07-10 DIAGNOSIS — J301 Allergic rhinitis due to pollen: Secondary | ICD-10-CM

## 2010-07-10 NOTE — Assessment & Plan Note (Signed)
Summary: rov 6 wks ///kp   Visit Type:  Follow-up Copy to:  Syliva Overman Primary Provider/Referring Provider:  Syliva Overman  CC:  Pt states breathing is better, no wheezing, and intermittent productive cough with small amounts of green mucus.  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 05/2009--Initial pulmonary consult-   May 09, 2010- 34 yoM  referred for allergy evaluation, coming today with his wife.He sees Dr Carolyne Fiscal for PCP and Dr Vassie Loll for pulmonary. He had sneeze and rinitis in [place] before moving here 5 years ago. After 4 years here, he began having wheezing in July, 2010. It is unclear if symptoms relate to the job.r. He did not improve with a 2 month leave of absence from the job. Denies childhood respiratory disease .Wheeze, sneeze, runny nose, watery eyes and itching in throat are worse in Spring and Fall. OTC antihistamines are not very helpful. Lab- Allergy Profile broadly elevated specific IgE w/ total IgE 1100.       - CT sinus 07/22/09- pansinusitis vs polyps       - CXR 04/14/10- mild bronchitis/ peribronchialthickening w/ resolution of pneumonia since 01/2010. 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 Quit cigs 2010.  May 14, 2010- Allergic rhinitis, Asthma...............Marland Kitchenwife here Returning today as scheduled for allergy skin testing. He is feeling much  better on prednisone 20 mg daily, w/ plan to drop to 10 mg daily after 7 days. Still noting chest congestion.  Stopped Xyzal and Singulair 4 days ago.  Skin test- Positive grass, tree, dust/ mite, cat  May 26, 2010 4:10 PM  Started on allergy shots twice/ wk. Just completed course of prednisone - feels good. Compliant with inhaled steroids & singulair .    Asthma History    Asthma Control Assessment:    Age range: 12+ years    Symptoms: >2 days/week  Nighttime Awakenings: 0-2/month    Interferes w/ normal activity: some limitations    SABA use (not for EIB): >2 days/week    FEV1: 3.43 liters (today)    FEV1 Pred: 4.00 liters (today)    Asthma Control Assessment: Not Well Controlled   Preventive Screening-Counseling & Management  Alcohol-Tobacco     Smoking Status: quit     Packs/Day: 0.5     Year Started: 1991     Year Quit: 2010     Passive Smoke Exposure: no  Caffeine-Diet-Exercise     Caffeine use/day: 1     Does Patient Exercise: no  Current Medications (verified): 1)  Advair Diskus 500-50 Mcg/dose Aepb (Fluticasone-Salmeterol) .... Inhale 1 Puff Two Times A Day 2)  Singulair 10 Mg Tabs (Montelukast Sodium) .Marland Kitchen.. 1 By Mouth Daily 3)  Xyzal 5 Mg Tabs (Levocetirizine Dihydrochloride) .... Take 1 Tablet By Mouth Once A Day 4)  Aciphex 20 Mg Tbec (Rabeprazole Sodium) .... Once Daily 5)  Flonase 50 Mcg/act Susp (Fluticasone Propionate) .... Two Times A Day 6)  Sinus Rinse Kit  Pack (Hypertonic Nasal Wash) .... Every 2-3 Days 7)  Combivent 18-103 Mcg/act Aero (Ipratropium-Albuterol) .Marland Kitchen.. 1-2 Puffs Every 4-6 Hours As Needed 8)  Optivar 0.05 % Soln (Azelastine Hcl) .Marland Kitchen.. 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 Pt lives with long-time girlfriend.  pt has 3 children.  2001- daughter 62- son 19- son Former Smoker- started at age 51.  110 cigs per day.  quit July 2010. Alcohol use-no, quit 10/2008. was a casual drinker Drug use-no Regular exercise-no  Review of Systems  The patient denies anorexia, fever, weight loss,  weight gain, vision loss, decreased hearing, hoarseness, chest pain, syncope, dyspnea on exertion, peripheral edema, prolonged cough, headaches, hemoptysis, abdominal pain, melena, hematochezia, severe indigestion/heartburn, hematuria, muscle weakness, suspicious skin lesions, transient blindness, difficulty walking, depression, unusual weight change, abnormal bleeding, enlarged lymph nodes, and angioedema.    Vital Signs:  Patient profile:   35 year old male Height:      67 inches Weight:      225.2 pounds BMI:     35.40 O2 Sat:      97 % on Room air Temp:     98.5 degrees F oral Pulse rate:   65 / minute BP sitting:   118 / 80  (right arm) Cuff size:   large  Vitals Entered By: Zackery Barefoot CMA (May 26, 2010 3:50 PM)  O2 Flow:  Room air CC: Pt states breathing is better, no wheezing, intermittent productive cough with small amounts of green mucus Comments Medications reviewed with patient Verified contact number and pharmacy with patient Zackery Barefoot CMA  May 26, 2010 3:50 PM    Physical Exam  Additional Exam:  General: A/Ox3; pleasant and cooperative, NAD, SKIN: no rash, lesions,  NODES: no lymphadenopathy HEENT: [place]/AT, EOM- WNL, Conjuctivae- clear, PERRLA, TM-WNL, Nose- turbinate edema, no polyps Throat- clear and wnl. Mallampati  III NECK: Supple w/ fair ROM, JVD- none, normal carotid impulses w/o bruits Thyroid- normal to palpation CHEST: Caorse breath sounds w/out active wheeze or cough. HEART: RRR, no m/g/r heard UXL:KGMW, nl pulses, no edema         Pre-Spirometry FEV1    Value: 3.43 L     Pred: 4.00 L     Impression & Recommendations:  Problem # 1:  ASTHMA, PERSISTENT (ICD-493.90) Moderate persistent Hoping that allergy shots will control his symptoms - otherwise consider xolair Discussed allergen avoidance, avoid NSAIDs Discusse dplan for exacerbation incl self -administration of steroids - Rx given  Problem # 2:  SINUSITIS, CHRONIC  (ICD-473.9) Avoid NSAIDs Long course of ABx if this recurs. ENT input ?  Medications Added to Medication List This Visit: 1)  Prednisone 10 Mg Tabs (Prednisone) .... Take 2 tabs once daily  x7 ds , then 1 tab once daily with food  Other Orders: Est. Patient Level III (10272) Prescription Created Electronically [id number])  Patient Instructions: 1)  Copy sent to: 2)  Please schedule a follow-up appointment in 2 months with TP 3)  Prednisone Rx sent to pharmacy - in case wheezing gets worse Prescriptions: PREDNISONE 10 MG TABS (PREDNISONE) take 2 tabs once daily  x7 ds , then 1 tab once daily with food  #30 x 0   Entered and Authorized by:   Comer Locket Vassie Loll MD   Signed by:   Comer Locket Vassie Loll MD on 05/26/2010   Method used:   Faxed to ...       [facility] - [facility] (retail)       [address] [place], [place]  [address]  Ph: [phone]       Fax: [phone]   RxID:   [id number]

## 2010-07-10 NOTE — Assessment & Plan Note (Signed)
Summary: ALLERGY TESTING- OK PER KATIE//KP   Vital Signs:  Patient profile:   35 year old male Height:      67 inches Weight:      221.25 pounds BMI:     34.78 O2 Sat:      92 % on Room air Pulse rate:   93 / minute BP sitting:   120 / 78  (left arm) Cuff size:   regular  Vitals Entered By: Reynaldo Minium CMA (May 14, 2010 2:10 PM)  O2 Flow:  Room air CC: allergy testing.   Copy to:  Syliva Overman Primary Provider/Referring Provider:  Syliva Overman  CC:  allergy testing.Marland Kitchen  History of Present Illness: May 09, 2010- 34 yoM  referred for allergy evaluation, coming today with his wife.He sees Dr Carolyne Fiscal for PCP and Dr Vassie Loll for pulmonary. He had sneeze and rinitis in [place] before moving here 5 years ago. After 4 years here, he began having wheezing in July, 2010. It is unclear if symptoms relate to the job.r. He did not improve with a 2 month leave of absence from the job. Denies childhood respiratory disease .Wheeze, sneeze, runny nose, watery eyes and itching in throat are worse in Spring and Fall. OTC antihistamines are not very helpful. Lab- Allergy Profile broadly elevated specific IgE w/ total IgE 1100.       - CT sinus 07/22/09- pansinusitis vs polyps       - CXR 04/14/10- mild bronchitis/ peribronchialthickening w/ resolution of pneumonia since 01/2010. 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 Quit cigs 2010.  May 14, 2010- Allergic rhinitis, Asthma...............Marland Kitchenwife here Returning today as scheduled for allergy skin testing. He is feeling much  better on prednisone 20 mg daily, w/ plan to drop to 10 mg daily after 7 days. Still noting chest congestion.  Stopped Xyzal and Singulair 4 days ago.  Skin test- Positive grass, tree, dust/ mite, cat     Preventive Screening-Counseling & Management  Alcohol-Tobacco     Smoking Status: quit     Packs/Day: 0.5     Year Started: 1991     Year  Quit: 2010     Passive Smoke Exposure: no  Current Medications (verified): 1)  Advair Diskus 500-50 Mcg/dose Aepb (Fluticasone-Salmeterol) .... Inhale 1 Puff Two Times A Day 2)  Singulair 10 Mg Tabs (Montelukast Sodium) .Marland Kitchen.. 1 By Mouth Daily 3)  Xyzal 5 Mg Tabs (Levocetirizine Dihydrochloride) .... Take 1 Tablet By Mouth Once A Day 4)  Aciphex 20 Mg Tbec (Rabeprazole Sodium) .... Once Daily 5)  Flonase 50 Mcg/act Susp (Fluticasone Propionate) .... Two Times A Day 6)  Sinus Rinse Kit  Pack (Hypertonic Nasal Wash) .... Every 2-3 Days 7)  Combivent 18-103 Mcg/act Aero (Ipratropium-Albuterol) .Marland Kitchen.. 1-2 Puffs Every 4-6 Hours As Needed 8)  Optivar 0.05 % Soln (Azelastine Hcl) .Marland Kitchen.. 1 Drop in Each Eye Daily 9)  Prednisone 10 Mg Tabs (Prednisone) .... 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 Pt lives with long-time girlfriend.  pt has 3 children.  2001- daughter 102- son 20- son Former Smoker- started at age 26.  2 cigs per day.  quit July 2010. Alcohol use-no, quit 10/2008. was a casual drinker Drug use-no Regular exercise-no  Risk Factors: Caffeine Use: 1 (05/21/2009) Exercise: no (05/21/2009)  Risk Factors: Smoking Status: quit (05/14/2010) Packs/Day: 0.5 (05/14/2010) Passive Smoke Exposure: no (05/14/2010)  Review of Systems      See HPI       The patient complains of shortness of breath  with activity, non-productive cough, nasal congestion/difficulty breathing through nose, and sneezing.  The patient denies shortness of breath at rest, productive cough, coughing up blood, chest pain, irregular heartbeats, acid heartburn, indigestion, loss of appetite, weight change, abdominal pain, difficulty swallowing, sore throat, tooth/dental problems, headaches, ear ache, hand/feet swelling, rash, change in color of mucus, and fever.    Physical Exam  Additional Exam:  General: A/Ox3; pleasant and cooperative, NAD, SKIN: no rash, lesions,  NODES: no lymphadenopathy HEENT: [place]/AT, EOM- WNL, Conjuctivae- clear, PERRLA, TM-WNL, Nose- turbinate edema, no polyps Throat- clear and wnl. Mallampati  III NECK: Supple w/ fair ROM, JVD- none, normal carotid impulses w/o bruits Thyroid- normal to palpation CHEST: Caorse breath sounds w/out active wheeze or cough. HEART: RRR, no m/g/r heard ABDOMEN:overweight RJJ:OACZ, nl pulses, no edema  NEURO: Grossly intact to observation       Impression & Recommendations:  Problem # 1:  ASTHMA, PERSISTENT (ICD-493.90) He is having difficulty controlling asthma with meds and environmental precautions. 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 His allergy tests are convincing. I have discussed treatment options with him. currently his IgE is out of range for Xolair. We discussed allergy vaccine which could be a very good option. We discussed the protocol, and carefully discussed realistic goals, potential lack of efficacy, and potential for allergic reaction up to and including anaphylaxis and death. He inidcated understanding and asked when we could start.   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 I don't believe he is exhibiting ABPA or equivalent, but will keep the possibility in mind.   Medications Added to Medication List This Visit: 1)  Allergy Vaccine New Start Gh   Other  Orders: Est. Patient Level III (66063)  Patient Instructions: 1)  Please schedule a follow-up appointment in 1 month. 2)  take prednisone 2 daily x 7 days, then 1 daily x 7 days, then one every other day 3)  I will have the allergy lab call you about starting allergy shots.    Orders Added: 1)  Est. Patient Level III [01601]     Appended Document: Orders Update    Clinical Lists Changes  Orders: Added new Service order of Allergy Puncture Test (09323) - Signed Added new Service order of Allergy I.D Test (55732) - Signed

## 2010-07-10 NOTE — Miscellaneous (Signed)
Summary: Injection record/[facility]  Injection record/[facility]   Imported By: Sherian Rein 05/23/2010 14:10:35  _____________________________________________________________________  External Attachment:    Type:   Image     Comment:   External Document

## 2010-07-10 NOTE — Miscellaneous (Signed)
Summary: New Vaccine Rx  New Vaccine Rx   Imported By: Lester Avoca 06/20/2010 11:12:03  _____________________________________________________________________  External Attachment:    Type:   Image     Comment:   External Document

## 2010-07-10 NOTE — Assessment & Plan Note (Signed)
Summary: 1 month return/mhh   Copy to:  Syliva Overman Primary Provider/Referring Provider:  Syliva Overman  CC:  1 month follow up visit-asthma and allergies.  History of Present Illness:  History of Present Illness: May 09, 2010- 35 yoM  referred for allergy evaluation, coming today with his wife.He sees Dr Carolyne Fiscal for PCP and Dr Vassie Loll for pulmonary. He had sneeze and rinitis in [place] before moving here 5 years ago. After 4 years here, he began having wheezing in July, 2010. It is unclear if symptoms relate to the job.r. He did not improve with a 2 month leave of absence from the job. Denies childhood respiratory disease .Wheeze, sneeze, runny nose, watery eyes and itching in throat are worse in Spring and Fall. OTC antihistamines are not very helpful. Lab- Allergy Profile broadly elevated specific IgE w/ total IgE 1100.       - CT sinus 07/22/09- pansinusitis vs polyps       - CXR 04/14/10- mild bronchitis/ peribronchialthickening w/ resolution of pneumonia since 01/2010. 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 Quit cigs 2010.  May 14, 2010- Allergic rhinitis, Asthma...............Marland Kitchenwife here Returning today as scheduled for allergy skin testing. He is feeling much  better on prednisone 20 mg daily, w/ plan to drop to 10 mg daily after 7 days. Still noting chest congestion.  Stopped Xyzal and Singulair 4 days ago.  Skin test- Positive grass, tree, dust/ mite, cat  June 19, 2010- Allergic rhinitis, Asthma Nurse-CC: 1 month follow up visit-asthma and allergies Saw Dr Vassie Loll for primary f/u of his astma in midDecember. Feels well today after recent  prednisone. Has started allergy vaccine.  Last year he was on prednisone 1 week out of 4. This year he is doing better so far.     Asthma History    Asthma Control Assessment:    Age range: 12+ years    Symptoms: 0-2 days/week    Nighttime Awakenings: 0-2/month  Interferes w/ normal activity: no limitations    SABA use (not for EIB): 0-2 days/week    FEV1: 3.43 liters (today)    FEV1 Pred: 4.00 liters (today)    Asthma Control Assessment: Well Controlled   Preventive Screening-Counseling & Management  Alcohol-Tobacco     Smoking Status: quit     Packs/Day: 0.5     Year Started: 1991     Year Quit: 2010     Passive Smoke Exposure: no  Current Medications (verified): 1)  Advair Diskus 500-50 Mcg/dose Aepb (Fluticasone-Salmeterol) .... Inhale 1 Puff Two Times A Day 2)  Singulair 10 Mg Tabs (Montelukast Sodium) .Marland Kitchen.. 1 By Mouth Daily 3)  Xyzal 5 Mg Tabs (Levocetirizine Dihydrochloride) .... Take 1 Tablet By Mouth Once A Day 4)  Aciphex 20 Mg Tbec (Rabeprazole Sodium) .... Once Daily 5)  Flonase 50 Mcg/act Susp (Fluticasone Propionate) .... Two Times A Day 6)  Sinus Rinse Kit  Pack (Hypertonic Nasal Wash) .... Every 2-3 Days 7)  Combivent 18-103 Mcg/act Aero (Ipratropium-Albuterol) .Marland Kitchen.. 1-2 Puffs Every 4-6 Hours As Needed 8)  Optivar 0.05 % Soln (Azelastine Hcl) .Marland Kitchen.. 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 Pt lives with long-time girlfriend.  pt has 3 children.  2001- daughter 61- son 102- son Former Smoker- started at age 20.  55 cigs per day.  quit July 2010. Alcohol use-no, quit 10/2008. was a casual drinker Drug  use-no Regular exercise-no  Risk Factors: Caffeine Use: 1 (05/26/2010) Exercise: no (05/26/2010)  Risk Factors: Smoking Status: quit (06/19/2010) Packs/Day: 0.5 (06/19/2010) Passive Smoke Exposure: no (06/19/2010)  Review of Systems      See HPI  The patient denies shortness of breath with activity, shortness of breath at rest, productive cough, non-productive cough, coughing up blood, chest pain, irregular heartbeats, acid heartburn, indigestion, loss of appetite, weight change, abdominal pain, difficulty swallowing, sore throat, tooth/dental problems, headaches, nasal congestion/difficulty breathing through nose, and sneezing.    Vital Signs:  Patient profile:   35 year old male Height:      67 inches Weight:      223.25 pounds BMI:     35.09 O2 Sat:      99 % on Room air Pulse rate:   71 / minute BP sitting:   118 / 72  (left arm) Cuff size:   large  Vitals Entered By: Reynaldo Minium CMA (June 19, 2010 10:05 AM)  O2 Flow:  Room air CC: 1 month follow up visit-asthma and allergies   Physical Exam  Additional Exam:  General: A/Ox3; pleasant and cooperative, NAD, SKIN: no rash, lesions,  NODES: no lymphadenopathy HEENT: [place]/AT, EOM- WNL, Conjuctivae- clear, PERRLA, TM-WNL, Nose- turbinate edema, no polyps Throat- clear and wnl. Mallampati  III NECK: Supple w/ fair ROM, JVD- none, normal carotid impulses w/o bruits Thyroid- normal to palpation CHEST: clear today to P&A,unlabored with no cough or wheeze. HEART: RRR, no m/g/r heard NGE:XBMW, nl pulses, no edema         Pre-Spirometry FEV1    Value: 3.43 L     Pred: 4.00 L     Impression & Recommendations:  Problem # 1:  ASTHMA, PERSISTENT (ICD-493.90) We discussed again the choice between Allergy vaccine and Xolair and he is comfortable with this. We discussed steroid side effects and answered his questionsl  We are going to continue allergy vaccine build-up and watch with interest as the Spring season begins,  since usually that is his worst time.  Problem # 2:  ALLERGIC RHINITIS (ICD-477.9)  Not currently uncomfortable with this. He can use symtomatic meds as needed.  His updated medication list for this problem includes:    Xyzal 5 Mg Tabs (Levocetirizine dihydrochloride) .Marland Kitchen... Take 1 tablet by mouth once a day    Flonase 50 Mcg/act Susp (Fluticasone propionate) .Marland Kitchen..Marland Kitchen Two times a day    Sinus Rinse Kit Pack (Hypertonic nasal wash) ..... Every 2-3 days  Other Orders: Est. Patient Level III (41324)  Patient Instructions: 1)  Please schedule a follow-up appointment in 4 months. Please call sooner if you have questions or any issues about your allergy shots.  2)  Continue to follow with Dr Vassie Loll as directed.

## 2010-07-14 DIAGNOSIS — J301 Allergic rhinitis due to pollen: Secondary | ICD-10-CM

## 2010-07-15 DIAGNOSIS — J301 Allergic rhinitis due to pollen: Secondary | ICD-10-CM

## 2010-07-17 DIAGNOSIS — J301 Allergic rhinitis due to pollen: Secondary | ICD-10-CM

## 2010-07-22 ENCOUNTER — Ambulatory Visit (INDEPENDENT_AMBULATORY_CARE_PROVIDER_SITE_OTHER): Payer: Self-pay

## 2010-07-22 DIAGNOSIS — J301 Allergic rhinitis due to pollen: Secondary | ICD-10-CM

## 2010-07-25 ENCOUNTER — Ambulatory Visit (INDEPENDENT_AMBULATORY_CARE_PROVIDER_SITE_OTHER): Payer: Self-pay

## 2010-07-25 DIAGNOSIS — J301 Allergic rhinitis due to pollen: Secondary | ICD-10-CM

## 2010-07-28 ENCOUNTER — Ambulatory Visit (INDEPENDENT_AMBULATORY_CARE_PROVIDER_SITE_OTHER): Payer: Self-pay

## 2010-07-28 DIAGNOSIS — J301 Allergic rhinitis due to pollen: Secondary | ICD-10-CM

## 2010-07-30 NOTE — Miscellaneous (Signed)
Summary: Injection [employer]   Imported By: Sherian Rein 07/23/2010 15:04:50  _____________________________________________________________________  External Attachment:    Type:   Image     Comment:   External Document

## 2010-07-31 ENCOUNTER — Telehealth (INDEPENDENT_AMBULATORY_CARE_PROVIDER_SITE_OTHER): Payer: Self-pay | Admitting: *Deleted

## 2010-07-31 ENCOUNTER — Ambulatory Visit (INDEPENDENT_AMBULATORY_CARE_PROVIDER_SITE_OTHER): Payer: Self-pay

## 2010-07-31 DIAGNOSIS — J301 Allergic rhinitis due to pollen: Secondary | ICD-10-CM

## 2010-08-04 ENCOUNTER — Ambulatory Visit (INDEPENDENT_AMBULATORY_CARE_PROVIDER_SITE_OTHER): Payer: Self-pay

## 2010-08-04 ENCOUNTER — Encounter: Payer: Self-pay | Admitting: Internal Medicine

## 2010-08-04 DIAGNOSIS — J301 Allergic rhinitis due to pollen: Secondary | ICD-10-CM

## 2010-08-05 NOTE — Progress Notes (Signed)
Summary: sick  Phone Note Call from Patient Call back at Home Phone [phone]   Caller: Patient Call For: young Reason for Call: Acute Illness, Talk to Nurse Summary of Call: Patient requesting appt.  Patient c/o sob, cough, wheezing, does not feel well.  Please advise Initial call taken by: Lehman Prom,  July 31, 2010 9:20 AM  Follow-up for Phone Call        called and spoke with pt.  states symptoms started 3 to 4 days ago.  pt c/o productive cough with green sputum, head congestion with green nasal drainage, sore throat, sweats (denies fever or chills), wheezing, tightness in chest, and increased sob.  states combivent only helps with sob " a little bit."  Pt requesting to be seen today.  No appts avail.  Please advise.   Aundra Millet Reynolds LPN  July 31, 2010 10:07 AM Allergies:  [facility] pharmacy: [facility]  Additional Follow-up for Phone Call Additional follow up Details #1::        Offer 330pm appt tomorrow Friday 08-01-2010; if this spot is gone by the time you reach patient then offer Zpak # 1 take as directed and Predinsone 10mg  #20 take 4 x 2 days, 3 x 2 days, 2 x 2days, 1 x 2 days no refills.Reynaldo Minium CMA  July 31, 2010 10:21 AM     Additional Follow-up for Phone Call Additional follow up Details #2::    Spoke with pt and notified rxs were sent to pharm.  (The appt was already taken).  Pt verbalized understanding.  Follow-up by: Vernie Murders,  July 31, 2010 11:09 AM  New/Updated Medications: ZITHROMAX Z-PAK 250 MG TABS (AZITHROMYCIN) take as directed PREDNISONE 10 MG TABS (PREDNISONE) 4 x 2 days, 3 x 2 days, 2 x 2 days, 1 x 2 days, then stop Prescriptions: PREDNISONE 10 MG TABS (PREDNISONE) 4 x 2 days, 3 x 2 days, 2 x 2 days, 1 x 2 days, then stop  #20 x 0   Entered by:   Vernie Murders   Authorized by:   Waymon Budge MD   Signed by:   Vernie Murders on 07/31/2010   Method used:   Faxed to ...       [facility]  (retail)       [address] [place], [place]  [address]       Ph: [phone]       Fax: [phone]   RxID:   [id number] ZITHROMAX Z-PAK 250 MG TABS (AZITHROMYCIN) take as directed  #1 x 0   Entered by:   Vernie Murders   Authorized by:   Waymon Budge MD   Signed by:   Vernie Murders on 07/31/2010   Method used:   Faxed to ...       [facility] (retail)       [address] [place], [place]  [address]       Ph: [phone]       Fax: [phone]   RxID:   [id number]

## 2010-08-07 ENCOUNTER — Encounter: Payer: Self-pay | Admitting: Internal Medicine

## 2010-08-07 ENCOUNTER — Ambulatory Visit (INDEPENDENT_AMBULATORY_CARE_PROVIDER_SITE_OTHER): Payer: Self-pay

## 2010-08-07 DIAGNOSIS — J301 Allergic rhinitis due to pollen: Secondary | ICD-10-CM

## 2010-08-11 ENCOUNTER — Ambulatory Visit (INDEPENDENT_AMBULATORY_CARE_PROVIDER_SITE_OTHER): Payer: Self-pay

## 2010-08-12 ENCOUNTER — Encounter: Payer: Self-pay | Admitting: Internal Medicine

## 2010-08-12 DIAGNOSIS — J301 Allergic rhinitis due to pollen: Secondary | ICD-10-CM

## 2010-08-14 NOTE — Assessment & Plan Note (Signed)
Summary: ALLERGY/CB   Nurse Visit   Allergies: No Known Drug Allergies  Orders Added: 1)  Allergy Injection (1) [95115] 

## 2010-08-15 ENCOUNTER — Encounter: Payer: Self-pay | Admitting: Internal Medicine

## 2010-08-15 ENCOUNTER — Ambulatory Visit (INDEPENDENT_AMBULATORY_CARE_PROVIDER_SITE_OTHER): Payer: Self-pay

## 2010-08-15 DIAGNOSIS — J301 Allergic rhinitis due to pollen: Secondary | ICD-10-CM

## 2010-08-18 ENCOUNTER — Encounter: Payer: Self-pay | Admitting: Internal Medicine

## 2010-08-19 NOTE — Assessment & Plan Note (Signed)
Summary: allergy/cb  Nurse Visit   Allergies: No Known Drug Allergies  Orders Added: 1)  Allergy Injection (1) [95115] 

## 2010-08-20 ENCOUNTER — Ambulatory Visit (INDEPENDENT_AMBULATORY_CARE_PROVIDER_SITE_OTHER): Payer: Self-pay

## 2010-08-21 ENCOUNTER — Encounter: Payer: Self-pay | Admitting: Internal Medicine

## 2010-08-21 DIAGNOSIS — J301 Allergic rhinitis due to pollen: Secondary | ICD-10-CM

## 2010-08-22 ENCOUNTER — Ambulatory Visit (INDEPENDENT_AMBULATORY_CARE_PROVIDER_SITE_OTHER): Payer: Self-pay | Admitting: Adult Health

## 2010-08-22 ENCOUNTER — Ambulatory Visit (INDEPENDENT_AMBULATORY_CARE_PROVIDER_SITE_OTHER): Payer: Self-pay

## 2010-08-22 ENCOUNTER — Encounter: Payer: Self-pay | Admitting: Adult Health

## 2010-08-22 DIAGNOSIS — J45909 Unspecified asthma, uncomplicated: Secondary | ICD-10-CM

## 2010-08-22 DIAGNOSIS — J329 Chronic sinusitis, unspecified: Secondary | ICD-10-CM

## 2010-08-25 ENCOUNTER — Encounter: Payer: Self-pay | Admitting: Internal Medicine

## 2010-08-25 ENCOUNTER — Ambulatory Visit (INDEPENDENT_AMBULATORY_CARE_PROVIDER_SITE_OTHER): Payer: Self-pay

## 2010-08-25 DIAGNOSIS — J301 Allergic rhinitis due to pollen: Secondary | ICD-10-CM

## 2010-08-25 DIAGNOSIS — J309 Allergic rhinitis, unspecified: Secondary | ICD-10-CM

## 2010-08-26 NOTE — Assessment & Plan Note (Signed)
Summary: Acute NP office visit - asthma   Copy to:  Syliva Overman Primary Provider/Referring Provider:  Syliva Overman  CC:  prod cough with yellow mucus, wheezing, increased SOB, and sweats x4days - was given zpak and pred taper for same symptoms 2.23.12.  History of Present Illness: May 09, 2010- 35 yoM  referred for allergy evaluation, coming today with his wife.He sees Dr Carolyne Fiscal for PCP and Dr Vassie Loll for pulmonary. He had sneeze and rinitis in [place] before moving here 5 years ago. After 4 years here, he began having wheezing in July, 2010. It is unclear if symptoms relate to the job.r. He did not improve with a 2 month leave of absence from the job. Denies childhood respiratory disease .Wheeze, sneeze, runny nose, watery eyes and itching in throat are worse in Spring and Fall. OTC antihistamines are not very helpful. Lab- Allergy Profile broadly elevated specific IgE w/ total IgE 1100.       - CT sinus 07/22/09- pansinusitis vs polyps       - CXR 04/14/10- mild bronchitis/ peribronchialthickening w/ resolution of pneumonia since 01/2010. 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 Quit cigs 2010.  May 14, 2010- Allergic rhinitis, Asthma...............Marland Kitchenwife here Returning today as scheduled for allergy skin testing. He is feeling much  better on prednisone 20 mg daily, w/ plan to drop to 10 mg daily after 7 days. Still noting chest congestion.  Stopped Xyzal and Singulair 4 days ago.  Skin test- Positive grass, tree, dust/ mite, cat  June 19, 2010- Allergic rhinitis, Asthma Nurse-CC: 1 month follow up visit-asthma and allergies Saw Dr Vassie Loll for primary f/u of his astma in midDecember. Feels well today after recent  prednisone. Has started allergy vaccine.  Last year he was on prednisone 1 week out of 4. This year he is doing better so far.    August 22, 2010 --Presents for an acute office visit. Complains of prod  cough with yellow mucus, wheezing, increased SOB, sweats x4days - was given zpak and pred taper for same symptoms 2.23.12. Got some better but never totally resolved. Over last week sinus congesiton and cough are getting worse. Not able to work. Worse at night. Denies chest pain,  orthopnea, hemoptysis, fever, n/v/d, edema, headache.   Medications Prior to Update: 1)  Advair Diskus 500-50 Mcg/dose Aepb (Fluticasone-Salmeterol) .... Inhale 1 Puff Two Times A Day 2)  Singulair 10 Mg Tabs (Montelukast Sodium) .Marland Kitchen.. 1 By Mouth Daily 3)  Xyzal 5 Mg Tabs (Levocetirizine Dihydrochloride) .... Take 1 Tablet By Mouth Once A Day 4)  Aciphex 20 Mg Tbec (Rabeprazole Sodium) .... Once Daily 5)  Flonase 50 Mcg/act Susp (Fluticasone Propionate) .... Two Times A Day 6)  Sinus Rinse Kit  Pack (Hypertonic Nasal Wash) .... Every 2-3 Days 7)  Combivent 18-103 Mcg/act Aero (Ipratropium-Albuterol) .Marland Kitchen.. 1-2 Puffs Every 4-6 Hours As Needed 8)  Optivar 0.05 % Soln (Azelastine Hcl) .Marland Kitchen.. 1 Drop in Each Eye Daily 9)  Allergy Vaccine New Start Gh 10)  Zithromax Z-Pak 250 Mg Tabs (Azithromycin) .... Take As Directed 11)  Prednisone 10 Mg Tabs (Prednisone) .... 4 X 2 Days, 3 X 2 Days, 2 X 2 Days, 1 X 2 Days, Then Stop  Current Medications (verified): 1)  Advair Diskus 500-50 Mcg/dose Aepb (Fluticasone-Salmeterol) .... Inhale 1 Puff Two Times A Day 2)  Singulair 10 Mg Tabs (Montelukast Sodium) .Marland Kitchen.. 1 By Mouth Daily 3)  Xyzal 5 Mg Tabs (Levocetirizine Dihydrochloride) .... Take 1 Tablet By Mouth Once A Day 4)  Aciphex 20 Mg Tbec (Rabeprazole Sodium) .... Once Daily 5)  Sinus Rinse Kit  Pack (Hypertonic Nasal Wash) .... Every 2-3 Days 6)  Combivent 18-103 Mcg/act Aero (Ipratropium-Albuterol) .Marland Kitchen.. 1-2 Puffs Every 4-6 Hours As Needed 7)  Optivar 0.05 % Soln (Azelastine Hcl) .Marland Kitchen.. 1 Drop in Each Eye Daily 8)  Allergy Vaccine New Start Gh 9)  Nasacort Aq 55 Mcg/act Aers (Triamcinolone Acetonide) .... 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  pt has 3 children.  2001- daughter 58- son 65- son Former Smoker- started at age 37.  66 cigs per day.  quit July 2010. Alcohol use-no, quit 10/2008. was a casual drinker Drug use-no Regular exercise-no  Risk Factors: Smoking Status: quit (06/19/2010) Packs/Day: 0.5 (06/19/2010) Passive Smoke Exposure: no (06/19/2010)  Review of Systems      See HPI  Vital Signs:  Patient profile:   35 year old male Height:      67 inches Weight:      224.13 pounds BMI:     35.23 O2 Sat:      95 % on Room air Temp:     98.2 degrees F oral Pulse rate:   62 / minute BP sitting:   114 / 80  (left arm) Cuff size:   large  Vitals Entered By: Boone Master CNA/MA (August 22, 2010 10:26 AM)  O2 Flow:  Room air CC: prod cough with yellow mucus, wheezing, increased SOB, sweats x4days - was given zpak and pred taper for same symptoms 2.23.12 Is Patient Diabetic? No Comments Medications reviewed with patient Daytime contact number verified with patient. Boone Master CNA/MA  August 22, 2010 10:26 AM    Physical Exam  Additional Exam:  General: A/Ox3; pleasant and cooperative, NAD, SKIN: no rash, lesions,  NODES: no lymphadenopathy HEENT: [place]/AT, EOM- WNL, Conjuctivae- clear, PERRLA, TM-WNL, Nose- turbinate edema, max tenderness  Throat- clear and  wnl. Mallampati  III NECK: Supple w/ fair ROM, JVD- none, normal carotid impulses w/o bruits Thyroid- normal to palpation CHEST: clear today to P&A,unlabored with no cough or wheeze. HEART: RRR, no m/g/r heard ZOX:WRUE, nl pulses, no edema         Impression & Recommendations:  Problem # 1:  SINUSITIS, CHRONIC (ICD-473.9)  Flare with asthma Plan   Augmentin 875mg  two times a day for 10 days Prednisone taper over next week.  Saline nasal rinses as needed  rest and fluids  Please contact office for sooner follow up if symptoms do not improve or worsen  follow up Dr. Maple Hudson as planned   Orders: Est. Patient Level IV (45409)  Medications Added to Medication List This Visit: 1)  Nasacort Aq 55 Mcg/act Aers (Triamcinolone acetonide) .... 2 sprays each nostril at bedtime 2)  Prednisone 10 Mg Tabs (Prednisone) .... 4 tabs for 2 days, then 3 tabs for 2 days, 2 tabs for 2 days,  then 1 tab for 2 days, then stop 3)  Augmentin 875-125 Mg Tabs (Amoxicillin-pot clavulanate) .Marland Kitchen.. 1 by mouth two times a day  Patient Instructions: 1)  Augmentin 875mg  two times a day for 10 days 2)  Prednisone taper over next week.  3)  Saline nasal rinses as needed  4)  rest and fluids  5)  Please contact office for sooner follow up if symptoms do not improve or worsen  6)  follow up Dr. Maple Hudson as planned  Prescriptions: AUGMENTIN 875-125 MG TABS (AMOXICILLIN-POT CLAVULANATE) 1 by mouth two times a day  #20 x 0   Entered and Authorized by:   Rubye Oaks NP   Signed by:   Antoneo Ghrist NP on 08/22/2010   Method used:   Print then Give to Patient   RxID:   [id number] PREDNISONE 10 MG TABS (PREDNISONE) 4 tabs for 2 days, then 3 tabs for 2 days, 2 tabs for 2 days, then 1 tab for 2 days, then stop  #20 x 0   Entered and Authorized by:   Rubye Oaks NP   Signed by:   Janeah Kovacich NP on 08/22/2010   Method used:   Print then Give to Patient   RxID:   [id number]

## 2010-08-26 NOTE — Assessment & Plan Note (Signed)
Summary: EXTRACT/CB  Nurse Visit   Allergies: No Known Drug Allergies  Orders Added: 1)  [facility] Therapy Services,1 or multi ([facility]) [95165] 

## 2010-08-26 NOTE — Assessment & Plan Note (Signed)
Summary: allergy/cb  Nurse Visit   Allergies: No Known Drug Allergies  Orders Added: 1)  Allergy Injection (1) [95115] 

## 2010-08-29 ENCOUNTER — Ambulatory Visit (INDEPENDENT_AMBULATORY_CARE_PROVIDER_SITE_OTHER): Payer: Self-pay

## 2010-08-29 DIAGNOSIS — J301 Allergic rhinitis due to pollen: Secondary | ICD-10-CM

## 2010-09-03 ENCOUNTER — Ambulatory Visit (INDEPENDENT_AMBULATORY_CARE_PROVIDER_SITE_OTHER): Payer: Self-pay

## 2010-09-03 DIAGNOSIS — J301 Allergic rhinitis due to pollen: Secondary | ICD-10-CM

## 2010-09-04 NOTE — Assessment & Plan Note (Signed)
Summary: allergy/cb  Nurse Visit   Allergies: No Known Drug Allergies  Orders Added: 1)  Allergy Injection (1) [95115] 

## 2010-09-10 ENCOUNTER — Ambulatory Visit (INDEPENDENT_AMBULATORY_CARE_PROVIDER_SITE_OTHER): Payer: Self-pay

## 2010-09-10 DIAGNOSIS — J301 Allergic rhinitis due to pollen: Secondary | ICD-10-CM

## 2010-09-10 LAB — BLOOD GAS, ARTERIAL
Bicarbonate: 22 mEq/L (ref 20.0–24.0)
TCO2: 23 mmol/L (ref 0–100)
pCO2 arterial: 31.9 mmHg — ABNORMAL LOW (ref 35.0–45.0)
pH, Arterial: 7.452 — ABNORMAL HIGH (ref 7.350–7.450)

## 2010-09-10 LAB — COMPREHENSIVE METABOLIC PANEL
AST: 24 U/L (ref 0–37)
Albumin: 4.3 g/dL (ref 3.5–5.2)
Calcium: 8.9 mg/dL (ref 8.4–10.5)
Creatinine, Ser: 1.11 mg/dL (ref 0.4–1.5)
GFR calc Af Amer: >60 mL/min (ref >60)

## 2010-09-10 LAB — BASIC METABOLIC PANEL
Chloride: 108 mEq/L (ref 96–112)
Creatinine, Ser: 0.97 mg/dL (ref 0.4–1.5)
GFR calc Af Amer: >60 mL/min (ref >60)
GFR calc non Af Amer: >60 mL/min (ref >60)
Potassium: 4.2 mEq/L (ref 3.5–5.1)

## 2010-09-10 LAB — POCT CARDIAC MARKERS: Myoglobin, poc: 56.4 ng/mL (ref 12–200)

## 2010-09-10 LAB — CBC
MCHC: 34.3 g/dL (ref 30.0–36.0)
MCV: 93.3 fL (ref 78.0–100.0)
MCV: 93.6 fL (ref 78.0–100.0)
Platelets: 205 10*3/uL (ref 150–400)
RBC: 5.07 MIL/uL (ref 4.22–5.81)
RDW: 13 % (ref 11.5–15.5)
WBC: 24.3 10*3/uL — ABNORMAL HIGH (ref 4.0–10.5)

## 2010-09-17 ENCOUNTER — Ambulatory Visit (INDEPENDENT_AMBULATORY_CARE_PROVIDER_SITE_OTHER): Payer: Self-pay

## 2010-09-17 DIAGNOSIS — J309 Allergic rhinitis, unspecified: Secondary | ICD-10-CM

## 2010-09-25 ENCOUNTER — Ambulatory Visit (INDEPENDENT_AMBULATORY_CARE_PROVIDER_SITE_OTHER): Payer: Self-pay

## 2010-09-25 DIAGNOSIS — J309 Allergic rhinitis, unspecified: Secondary | ICD-10-CM

## 2010-10-01 ENCOUNTER — Ambulatory Visit (INDEPENDENT_AMBULATORY_CARE_PROVIDER_SITE_OTHER): Payer: Self-pay

## 2010-10-01 DIAGNOSIS — J309 Allergic rhinitis, unspecified: Secondary | ICD-10-CM

## 2010-10-08 ENCOUNTER — Ambulatory Visit (INDEPENDENT_AMBULATORY_CARE_PROVIDER_SITE_OTHER): Payer: Self-pay

## 2010-10-08 DIAGNOSIS — J309 Allergic rhinitis, unspecified: Secondary | ICD-10-CM

## 2010-10-17 ENCOUNTER — Ambulatory Visit (INDEPENDENT_AMBULATORY_CARE_PROVIDER_SITE_OTHER): Payer: Self-pay

## 2010-10-17 ENCOUNTER — Encounter: Payer: Self-pay | Admitting: Internal Medicine

## 2010-10-17 DIAGNOSIS — J309 Allergic rhinitis, unspecified: Secondary | ICD-10-CM

## 2010-10-21 ENCOUNTER — Encounter: Payer: Self-pay | Admitting: Internal Medicine

## 2010-10-21 ENCOUNTER — Ambulatory Visit (INDEPENDENT_AMBULATORY_CARE_PROVIDER_SITE_OTHER): Payer: Self-pay

## 2010-10-21 ENCOUNTER — Ambulatory Visit (INDEPENDENT_AMBULATORY_CARE_PROVIDER_SITE_OTHER): Payer: Self-pay | Admitting: Internal Medicine

## 2010-10-21 VITALS — BP 110/74 | HR 70 | Ht 67.0 in | Wt 224.4 lb

## 2010-10-21 DIAGNOSIS — J309 Allergic rhinitis, unspecified: Secondary | ICD-10-CM

## 2010-10-21 DIAGNOSIS — J301 Allergic rhinitis due to pollen: Secondary | ICD-10-CM

## 2010-10-21 DIAGNOSIS — J45909 Unspecified asthma, uncomplicated: Secondary | ICD-10-CM

## 2010-10-21 NOTE — Assessment & Plan Note (Signed)
We discussed steroid side effects and aqdrenal insufficiency, steroid sparing and goals. Today he is clear on prednisone.

## 2010-10-21 NOTE — Progress Notes (Signed)
Subjective:     Patient ID: Mike Beck, male   DOB: 1975-09-03, 35 y.o.   MRN: [id number]  HPI 10/21/10- 36 yoM former smoker followed for asthma, allergic rhinitis, eosinophilia, chronic sinusitis. Last here June 19, 2010 for allergy. He continues with Dr Vassie Loll for general pulmonary and sees me for allergy management. Now at maintenance 1:50 on allergy vaccine as of March 9. So far he hasn't appreciated a big difference. Spring pollen was bad this year and he was needing frequent prednisone. Shots are well tolerated with only trivial local itching. On prednisone now. Continues singulair. IgE was 717 when checked- off range for Xolair.   Review of Systems Constitutional:   No weight loss, night sweats,  Fevers, chills, fatigue, lassitude. HEENT:   No headaches,  Difficulty swallowing,  Tooth/dental problems,  Sore throat,               CV:  No chest pain,  Orthopnea, PND, swelling in lower extremities, anasarca, dizziness, palpitations  GI  No heartburn, indigestion, abdominal pain, nausea, vomiting, diarrhea, change in bowel habits, loss of appetite  Resp:  On Prednisone now:  No shortness of breath with exertion or at rest.  No excess mucus, no productive cough,  No non-productive cough,  No coughing up of blood.  No change in color of mucus.  No wheezing. Skin: no rash or lesions.  GU: no dysuria, change in color of urine, no urgency or frequency.  No flank pain.  MS:  No joint pain or swelling.  No decreased range of motion.  No back pain.  Psych:  No change in mood or affect. No depression or anxiety.  No memory loss.      Objective:   Physical Exam General- Alert, Oriented, Affect-appropriate, Distress- none acute  Skin- rash-none, lesions- none, excoriation- none  Lymphadenopathy- none  Head- atraumatic  Eyes- Gross vision intact, PERRLA, conjunctivae clear secretions  Ears- Hearing, canals, Tm - normal  Nose- Clear, Septal dev, mucus, polyps, erosion, perforation    Throat- Mallampati II , mucosa clear , drainage- none, tonsils- atrophic  Neck- flexible , trachea midline, no stridor , thyroid nl, carotid no bruit  Chest - symmetrical excursion , unlabored     Heart/CV- RRR , no murmur , no gallop  , no rub, nl s1 s2                     - JVD- none , edema- none, stasis changes- none, varices- none     Lung- clear to P&A, wheeze- none, cough- none , dullness-none, rub- none     Chest wall-   Abd- tender-no, distended-no, bowel sounds-present, HSM- no  Br/ Gen/ Rectal- Not done, not indicated  Extrem- cyanosis- none, clubbing, none, atrophy- none, strength- nl  Neuro- grossly intact to observation      Assessment:      Plan:

## 2010-10-21 NOTE — Patient Instructions (Signed)
I will have the allergy lab advance the strength of your allergy vaccine one more concentration step, to 1:10, as of your next order.

## 2010-10-21 NOTE — Assessment & Plan Note (Signed)
We can advance now to 1:10. Over the next year we will look to see if we can stabilize his airways and perhaps bring IgE level down into Xolair range.

## 2010-10-23 ENCOUNTER — Encounter: Payer: Self-pay | Admitting: Internal Medicine

## 2010-10-23 NOTE — Assessment & Plan Note (Signed)
Continuing to watch for environmental triggers.

## 2010-10-28 ENCOUNTER — Ambulatory Visit (INDEPENDENT_AMBULATORY_CARE_PROVIDER_SITE_OTHER): Payer: Self-pay

## 2010-10-28 ENCOUNTER — Telehealth: Payer: Self-pay | Admitting: Internal Medicine

## 2010-10-28 DIAGNOSIS — J309 Allergic rhinitis, unspecified: Secondary | ICD-10-CM

## 2010-10-28 MED ORDER — PREDNISONE 10 MG PO TABS: ORAL_TABLET | ORAL | Status: DC

## 2010-10-28 MED ORDER — AZITHROMYCIN 250 MG PO TABS: ORAL_TABLET | ORAL | Status: AC

## 2010-10-28 NOTE — Telephone Encounter (Signed)
Spoke with pt.  He is c/o increased SOB, wheezing and prod cough with green sputum x 3 days. Had low grade fever last night. He is sched to see TP tomorrow, but states that he prefers that CDY call in something for him so that he does not have to come in. He states that if CDY wants him to just keep appt for tomorrow he will do this. Pls advise thanks! No Known Allergies

## 2010-10-28 NOTE — Telephone Encounter (Signed)
Spoke with pt. He states he does feel would benefit from prednisone taper. Rxs for this and for zpack were called to [facility]. Appt with TP was cancelled.

## 2010-10-28 NOTE — Telephone Encounter (Signed)
Per CY-offer Zpak #1 take as directed no refills and if wheezing bad enough ok to give Prednisone 10 mg #20 take 4 x 2 days, 3 x 2 days, 2 x 2 days, 1 x 2 days no refills. This way he doesn't have to miss work for an appt.

## 2010-10-29 ENCOUNTER — Ambulatory Visit: Payer: Self-pay | Admitting: Adult Health

## 2010-11-07 ENCOUNTER — Ambulatory Visit (INDEPENDENT_AMBULATORY_CARE_PROVIDER_SITE_OTHER): Payer: Self-pay

## 2010-11-07 ENCOUNTER — Encounter: Payer: Self-pay | Admitting: Internal Medicine

## 2010-11-07 DIAGNOSIS — J309 Allergic rhinitis, unspecified: Secondary | ICD-10-CM

## 2010-11-12 ENCOUNTER — Ambulatory Visit (INDEPENDENT_AMBULATORY_CARE_PROVIDER_SITE_OTHER): Payer: Self-pay

## 2010-11-12 DIAGNOSIS — J309 Allergic rhinitis, unspecified: Secondary | ICD-10-CM

## 2010-11-17 ENCOUNTER — Telehealth: Payer: Self-pay | Admitting: Pulmonary Disease

## 2010-11-17 MED ORDER — PREDNISONE 10 MG PO TABS: ORAL_TABLET | ORAL | Status: DC

## 2010-11-17 NOTE — Telephone Encounter (Signed)
LMOMTCB

## 2010-11-17 NOTE — Telephone Encounter (Signed)
Called and spoke with pt.  Pt aware to start on Prednisone 40mg  daily and ov scheduled with TP for Wed 6/13 at 10:00 am.  Rx sent to [facility] pharmacy.

## 2010-11-17 NOTE — Telephone Encounter (Signed)
Soonest available with TP  Meanwhile , have him start 40 mg prednisone until seen please

## 2010-11-17 NOTE — Telephone Encounter (Signed)
Called and spoke with pt.  Pt c/o increased sob with exertion x 4 days, wheezing, tightness in chest, coughing up green sputum, and chills and sweats.  Offered pt an appt today.  Pt declined stating he wasn't in [place] today.  Pt is requesting an appt for tomorrow but no appts avail tomorrow with any provider. Therefore,  RA please advise of recs.  Thanks.

## 2010-11-18 ENCOUNTER — Ambulatory Visit (INDEPENDENT_AMBULATORY_CARE_PROVIDER_SITE_OTHER): Payer: Self-pay

## 2010-11-18 DIAGNOSIS — J309 Allergic rhinitis, unspecified: Secondary | ICD-10-CM

## 2010-11-19 ENCOUNTER — Ambulatory Visit (INDEPENDENT_AMBULATORY_CARE_PROVIDER_SITE_OTHER): Payer: Self-pay

## 2010-11-19 ENCOUNTER — Encounter: Payer: Self-pay | Admitting: Adult Health

## 2010-11-19 ENCOUNTER — Ambulatory Visit (INDEPENDENT_AMBULATORY_CARE_PROVIDER_SITE_OTHER): Payer: Self-pay | Admitting: Adult Health

## 2010-11-19 VITALS — BP 124/90 | HR 79 | Temp 98.5°F | Ht 68.0 in | Wt 221.2 lb

## 2010-11-19 DIAGNOSIS — J45909 Unspecified asthma, uncomplicated: Secondary | ICD-10-CM

## 2010-11-19 DIAGNOSIS — J309 Allergic rhinitis, unspecified: Secondary | ICD-10-CM

## 2010-11-19 MED ORDER — AMOXICILLIN-POT CLAVULANATE 875-125 MG PO TABS: 1.0000 | ORAL_TABLET | Freq: Two times a day (BID) | ORAL | Status: AC

## 2010-11-19 MED ORDER — PREDNISONE 10 MG PO TABS: ORAL_TABLET | ORAL | Status: DC

## 2010-11-19 MED ORDER — LEVALBUTEROL HCL 0.63 MG/3ML IN NEBU
0.6300 mg | INHALATION_SOLUTION | Freq: Once | RESPIRATORY_TRACT | Status: AC
Start: 2010-11-19 — End: 2010-11-19
  Administered 2010-11-19: 0.63 mg via RESPIRATORY_TRACT

## 2010-11-19 NOTE — Progress Notes (Signed)
Subjective:     Patient ID: Mike Beck, male   DOB: Jun 20, 1975, 35 y.o.   MRN: [id number]  HPI May 09, 2010- 34 yoM referred for allergy evaluation, coming today with his wife.He sees Dr Carolyne Fiscal for PCP and Dr Vassie Loll for pulmonary. He had sneeze and rinitis in [place] before moving here 5 years ago. After 4 years here, he began having wheezing in July, 2010. It is unclear if symptoms relate to the job.r. He did not improve with a 2 month leave of absence from the job. Denies childhood respiratory disease .Wheeze, sneeze, runny nose, watery eyes and itching in throat are worse in Spring and Fall. OTC antihistamines are not very helpful.  Lab- Allergy Profile broadly elevated specific IgE w/ total IgE 1100.  - CT sinus 07/22/09- pansinusitis vs polyps  - CXR 04/14/10- mild bronchitis/ peribronchialthickening w/ resolution of pneumonia since 01/2010.  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 Quit cigs 2010.   May 14, 2010- Allergic rhinitis, Asthma...............Marland Kitchenwife here  Returning today as scheduled for allergy skin testing. He is feeling much better on prednisone 20 mg daily, w/ plan to drop to 10 mg daily after 7 days. Still noting chest congestion.  Stopped Xyzal and Singulair 4 days ago.  Skin test- Positive grass, tree, dust/ mite, cat   June 19, 2010- Allergic rhinitis, Asthma  Nurse-CC: 1 month follow up visit-asthma and allergies  Saw Dr Vassie Loll for primary f/u of his astma in midDecember. Feels well today after recent prednisone. Has started allergy vaccine. Last year he was on prednisone 1 week out of 4. This year he is doing better so far.   August 22, 2010 --Presents for an acute office visit. Complains of prod cough with yellow mucus, wheezing, increased SOB, sweats x4days - was given zpak and pred taper for same symptoms 2.23.12. Got some better but never totally resolved. Over last week sinus congesiton and  cough are getting worse. Not able to work. Worse at night. Denies chest pain, orthopnea, hemoptysis, fever, n/v/d, edema, headache.   10/21/10- 34 yoM former smoker followed for asthma, allergic rhinitis, eosinophilia, chronic sinusitis. Last here June 19, 2010 for allergy. He continues with Dr Vassie Loll for general pulmonary and sees me for allergy management. Now at maintenance 1:50 on allergy vaccine as of March 9. So far he hasn't appreciated a big difference. Spring pollen was bad this year and he was needing frequent prednisone. Shots are well tolerated with only trivial local itching. On prednisone now. Continues singulair. IgE was 717 when checked- off range for Xolair.   11/19/10 Acute OV  Returns for flare of symptoms. Complains of increased SOB, wheezing, prod cough with green mucus, f/c/s x5days - began pred 40mg  daily on 6.12.12. Pt has frequent flares requiring steroids. Says he is exposed to a lot of dust at work. No fever or chest pain.  Increased use of albuterol last 3 days. Has missed work last 2 days.   Review of Systems  Constitutional:   No weight loss, night sweats,  Fevers, chills, fatigue, lassitude. HEENT:   No headaches,  Difficulty swallowing,  Tooth/dental problems,  Sore throat,               CV:  No chest pain,  Orthopnea, PND, swelling in lower extremities, anasarca, dizziness, palpitations  GI  No heartburn, indigestion, abdominal pain, nausea, vomiting, diarrhea, change in bowel habits, loss of appetite  Resp:  No shortness of breath with exertion or at rest.  + excess mucus, no productive cough,    No coughing up of blood.  No change in color of mucus.  + wheezing.  Skin: no rash or lesions.  GU: no dysuria, change in color of urine, no urgency or frequency.  No flank pain.  MS:  No joint pain or swelling.  No decreased range of motion.  No back pain.  Psych:  No change in mood or affect. No depression or anxiety.  No memory loss.      Objective:    Physical Exam  General- Alert, Oriented, Affect-appropriate, Distress- none acute  Skin- rash-none, lesions- none, excoriation- none  Lymphadenopathy- none  Head- atraumatic  Eyes- Gross vision intact, PERRLA, conjunctivae clear secretions  Ears- Hearing, canals, Tm - normal  Nose- Clear, Septal dev, mucus, polyps, erosion, perforation   Throat- Mallampati II , mucosa clear , drainage- none, tonsils- atrophic  Neck- flexible , trachea midline, no stridor , thyroid nl, carotid no bruit  Chest - symmetrical excursion , unlabored     Heart/CV- RRR , no murmur , no gallop  , no rub, nl s1 s2                     - JVD- none , edema- none, stasis changes- none, varices- none     Lung- Coarse BS w/ exp wheezing   Abd- tender-no, distended-no, bowel sounds-present, HSM- no  Br/ Gen/ Rectal- Not done, not indicated  Extrem- cyanosis- none, clubbing, none, atrophy- none, strength- nl  Neuro- grossly intact to observation      Assessment:      Plan:

## 2010-11-19 NOTE — Patient Instructions (Signed)
Augmentin 875mg  Twice daily  For 10 days  Mucinex DM Twice daily  As needed  Cough/congestion Saline nasal rinses As needed   Prednisone 10mg  4 tabs for 2 days, then 3 tabs for 2 days, 2 tabs for 2 days, then 1 tab for 2 days, then stop Stop Advair  Begin Symbicort 160/4.75mcg 2 puffs Twice daily  -brush/rinse and gargle after use.  Please contact office for sooner follow up if symptoms do not improve or worsen or seek emergency care  follow up Dr. Vassie Loll  In 3-4 weeks

## 2010-11-21 NOTE — Assessment & Plan Note (Signed)
Exacerbation with associated sinusitis   Plan:  Augmentin 875mg  Twice daily  For 10 days  Mucinex DM Twice daily  As needed  Cough/congestion Saline nasal rinses As needed   Prednisone 10mg  4 tabs for 2 days, then 3 tabs for 2 days, 2 tabs for 2 days, then 1 tab for 2 days, then stop Stop Advair  Begin Symbicort 160/4.48mcg 2 puffs Twice daily  -brush/rinse and gargle after use.  Please contact office for sooner follow up if symptoms do not improve or worsen or seek emergency care  follow up Dr. Vassie Loll  In 3-4 weeks

## 2010-11-25 ENCOUNTER — Ambulatory Visit (INDEPENDENT_AMBULATORY_CARE_PROVIDER_SITE_OTHER): Payer: Self-pay

## 2010-11-25 DIAGNOSIS — J309 Allergic rhinitis, unspecified: Secondary | ICD-10-CM

## 2010-12-03 ENCOUNTER — Ambulatory Visit (INDEPENDENT_AMBULATORY_CARE_PROVIDER_SITE_OTHER): Payer: Self-pay

## 2010-12-03 DIAGNOSIS — J309 Allergic rhinitis, unspecified: Secondary | ICD-10-CM

## 2010-12-07 ENCOUNTER — Emergency Department (HOSPITAL_COMMUNITY)
Admission: EM | Admit: 2010-12-07 | Discharge: 2010-12-08 | Disposition: A | Discharge status: Home / Self Care | Payer: Self-pay | Attending: Emergency Medicine | Admitting: Emergency Medicine

## 2010-12-07 DIAGNOSIS — R05 Cough: Secondary | ICD-10-CM | POA: Insufficient documentation

## 2010-12-07 DIAGNOSIS — R059 Cough, unspecified: Secondary | ICD-10-CM | POA: Insufficient documentation

## 2010-12-07 DIAGNOSIS — R0602 Shortness of breath: Secondary | ICD-10-CM | POA: Insufficient documentation

## 2010-12-07 DIAGNOSIS — J3489 Other specified disorders of nose and nasal sinuses: Secondary | ICD-10-CM | POA: Insufficient documentation

## 2010-12-07 DIAGNOSIS — R0789 Other chest pain: Secondary | ICD-10-CM | POA: Insufficient documentation

## 2010-12-07 DIAGNOSIS — J45909 Unspecified asthma, uncomplicated: Secondary | ICD-10-CM | POA: Insufficient documentation

## 2010-12-08 ENCOUNTER — Ambulatory Visit (INDEPENDENT_AMBULATORY_CARE_PROVIDER_SITE_OTHER): Payer: Self-pay

## 2010-12-08 DIAGNOSIS — J309 Allergic rhinitis, unspecified: Secondary | ICD-10-CM

## 2010-12-11 ENCOUNTER — Encounter: Payer: Self-pay | Admitting: Pulmonary Disease

## 2010-12-11 ENCOUNTER — Ambulatory Visit (INDEPENDENT_AMBULATORY_CARE_PROVIDER_SITE_OTHER): Payer: Self-pay | Admitting: Pulmonary Disease

## 2010-12-11 VITALS — BP 140/78 | HR 74 | Temp 97.9°F | Ht 68.0 in | Wt 223.0 lb

## 2010-12-11 DIAGNOSIS — J329 Chronic sinusitis, unspecified: Secondary | ICD-10-CM

## 2010-12-11 DIAGNOSIS — J45909 Unspecified asthma, uncomplicated: Secondary | ICD-10-CM

## 2010-12-11 MED ORDER — ALBUTEROL SULFATE (2.5 MG/3ML) 0.083% IN NEBU: 2.5000 mg | INHALATION_SOLUTION | Freq: Four times a day (QID) | RESPIRATORY_TRACT | Status: DC | PRN

## 2010-12-11 MED ORDER — PREDNISONE 10 MG PO TABS: ORAL_TABLET | ORAL | Status: DC

## 2010-12-11 NOTE — Patient Instructions (Signed)
Stay on 30 mg prednisone x 2 weeks Once better, then we will decrease again We will get you nebuliser & keep prednisone Rx pending in pharmacy at all times I will discuss xolair shots with Dr Maple Hudson Go back to [facility] & discuss surgery for sinuses

## 2010-12-11 NOTE — Progress Notes (Signed)
  Subjective:    Patient ID: Mike Beck, male    DOB: 1976-03-11, 35 y.o.   MRN: [id number]  HPI  PCP -Carolyne Fiscal Allergy- Young   42 yoM former smoker followed for asthma, allergic rhinitis, eosinophilia, chronic sinusitis.   He had sneeze and rinitis in [place] before moving here in 2007.Marland Kitchen After 4 years here, he began having wheezing in July, 2010. It is unclear if symptoms relate to the job. He did not improve with a 2 month leave of absence from the job. Denies childhood respiratory disease .Wheeze, sneeze, runny nose, watery eyes and itching in throat are worse in Spring and Fall. OTC antihistamines are not very helpful.  Lab- Allergy Profile broadly elevated specific IgE w/ total IgE 1100.  - CT sinus 07/22/09- pansinusitis vs polyps  - CXR 04/14/10- mild bronchitis/ peribronchialthickening w/ resolution of pneumonia since 01/2010.  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 Quit  cigs 2010.    10/21/10-  Now at maintenance 1:50 on allergy vaccine as of 3/12. So far he hasn't appreciated a big difference. Spring pollen was bad this year and he was needing frequent prednisone. Shots are well tolerated with only trivial local itching. On prednisone now. Continues singulair. IgE was 717 when checked- off range for Xolair.    12/11/2010 c/o wheezing, SOB, chest tightness, was seen and treated at [facility] 12-08-10 with Pred taper, down to 30 mg now Has been unable to work last few days. In general, he had poor asthma control with  frequent flares in the last few months On allergy shots  Since dec '11   Review of Systems Pt denies any significant  nasal congestion or excess secretions, fever, chills, sweats, unintended wt loss, pleuritic or exertional cp, orthopnea pnd or leg swelling.  Pt also denies any obvious fluctuation in symptoms with weather or environmental change or other alleviating or aggravating factors.    Pt reports   increase in rescue therapy over baseline, denies waking up needing it or having early am exacerbations or coughing/wheezing/ or dyspnea       Objective:   Physical Exam Gen. Pleasant, well-nourished, in no distress ENT - no lesions, no post nasal drip Neck: No JVD, no thyromegaly, no carotid bruits Lungs: no use of accessory muscles, no dullness to percussion,no rales , faint diffuse rhonchi  Cardiovascular: Rhythm regular, heart sounds  normal, no murmurs or gallops, no peripheral edema Musculoskeletal: No deformities, no cyanosis or clubbing         Assessment & Plan:

## 2010-12-12 NOTE — Assessment & Plan Note (Signed)
I focused today on discussing a plan for exacerbation - self administered steroid taper & nebuliser use. For now , he will stay on 30 mg prednisone & NOT taper  until seen again in 2 weeks. Ct symbicort & singulair Will try to get xolair - d/w dr Maple Hudson

## 2010-12-12 NOTE — Assessment & Plan Note (Signed)
[facility] referral , has not responded to medical management

## 2010-12-15 ENCOUNTER — Ambulatory Visit (INDEPENDENT_AMBULATORY_CARE_PROVIDER_SITE_OTHER): Payer: Self-pay

## 2010-12-15 DIAGNOSIS — J309 Allergic rhinitis, unspecified: Secondary | ICD-10-CM

## 2010-12-15 NOTE — Progress Notes (Signed)
Addended by: Julaine Hua on: 12/15/2010 03:29 PM   Modules accepted: Orders

## 2010-12-19 ENCOUNTER — Telehealth: Payer: Self-pay | Admitting: Pulmonary Disease

## 2010-12-19 NOTE — Telephone Encounter (Signed)
Received pt's information regarding xolair. Beginning xolair referral process. Called and [facility] for pt to return my call.

## 2010-12-23 ENCOUNTER — Ambulatory Visit (INDEPENDENT_AMBULATORY_CARE_PROVIDER_SITE_OTHER): Payer: Self-pay

## 2010-12-23 DIAGNOSIS — J309 Allergic rhinitis, unspecified: Secondary | ICD-10-CM

## 2010-12-25 ENCOUNTER — Ambulatory Visit (INDEPENDENT_AMBULATORY_CARE_PROVIDER_SITE_OTHER): Payer: Self-pay | Admitting: Adult Health

## 2010-12-25 ENCOUNTER — Encounter: Payer: Self-pay | Admitting: Adult Health

## 2010-12-25 DIAGNOSIS — J45909 Unspecified asthma, uncomplicated: Secondary | ICD-10-CM

## 2010-12-25 NOTE — Progress Notes (Signed)
  Subjective:    Patient ID: Mike Beck, male    DOB: [birth year], 35 y.o.   MRN: [id number]  HPI   PCP -Carolyne Fiscal Allergy- Young   81 yoM former smoker followed for asthma, allergic rhinitis, eosinophilia, chronic sinusitis.   He had sneeze and rinitis in [place] before moving here in 2007.Marland Kitchen After 4 years here, he began having wheezing in July, 2010. It is unclear if symptoms relate to the job. He did not improve with a 2 month leave of absence from the job. Denies childhood respiratory disease .Wheeze, sneeze, runny nose, watery eyes and itching in throat are worse in Spring and Fall. OTC antihistamines are not very helpful.  Lab- Allergy Profile broadly elevated specific IgE w/ total IgE 1100.  - CT sinus 07/22/09- pansinusitis vs polyps  - CXR 04/14/10- mild bronchitis/ peribronchialthickening w/ resolution of pneumonia since 01/2010.  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 Quit  cigs 2010.    10/21/10-  Now at maintenance 1:50 on allergy vaccine as of 3/12. So far he hasn't appreciated a big difference. Spring pollen was bad this year and he was needing frequent prednisone. Shots are well tolerated with only trivial local itching. On prednisone now. Continues singulair. IgE was 717 when checked- off range for Xolair.    12/11/2010 c/o wheezing, SOB, chest tightness, was seen and treated at [facility] ER 12-08-10 with Pred taper, down to 30 mg now Has been unable to work last few days. In general, he had poor asthma control with  frequent flares in the last few months On allergy shots  Since dec '11>>steroid burst of pred 30mg  x 2 w then 20mg [name]   12/25/2010  Pt returns for follow up . Reports breathing has improved since last ov.  currently taking prednisone 20mg  and was given amoxicillin rx x 2 weeks by ENT 6 days ago. He has upcoming CT sinus scan . Has ov with Dr. Ezzard Standing after CT scan to decide  He awaiting for xolair  decision/approval.  He is feeling much better. NO wheezing or cough right now. Best he has felt in a while.     Review of Systems  Pt denies any significant  nasal congestion or excess secretions, fever, chills, sweats, unintended wt loss, pleuritic or exertional cp, orthopnea pnd or leg swelling.  Pt also denies any obvious fluctuation in symptoms with weather or environmental change or other alleviating or aggravating factors.    Pt reports  increase in rescue therapy over baseline, denies waking up needing it or having early am exacerbations or coughing/wheezing/ or dyspnea       Objective:   Physical Exam  Gen. Pleasant, well-nourished, in no distress ENT - no lesions, no post nasal drip Neck: No JVD, no thyromegaly, no carotid bruits Lungs: no use of accessory muscles, no dullness to percussion,no rales , CTA w/ no wheezing  Cardiovascular: Rhythm regular, heart sounds  normal, no murmurs or gallops, no peripheral edema Musculoskeletal: No deformities, no cyanosis or clubbing         Assessment & Plan:

## 2010-12-25 NOTE — Patient Instructions (Addendum)
Finish Amoxicillin as directed .  follow up with Dr. Ezzard Standing as planned  Taper prednisone 20mg  daily for 5 days then  10mg  daily x 5 days and then 1/2 (5mg  ) daily -hold at this dose follow up Dr. Vassie Loll  In 1 month.  follow up Dr. Maple Hudson  As planned in 2 months as planned  Please contact office for sooner follow up if symptoms do not improve or worsen or seek emergency care    Late add : will keep on low dose steroids until on Xolair for a while  Case reviewed with Dr. Vassie Loll   Pt notified.

## 2010-12-25 NOTE — Assessment & Plan Note (Signed)
Resolving exacerbation with associated sinusitis  Currently awaiting xolair approval .   Plan:  Finish Amoxicillin as directed .  follow up with Dr. Ezzard Standing as planned  Taper prednisone 20mg  daily for 5 days then  10mg  daily x 5 days and then 1/2 (5mg  ) daily for 5 days and stop'  follow up Dr. Vassie Loll  In 1 month.  follow up Dr. Maple Hudson  As planned in 2 months as planned  Please contact office for sooner follow up if symptoms do not improve or worsen or seek emergency care

## 2010-12-30 ENCOUNTER — Ambulatory Visit (INDEPENDENT_AMBULATORY_CARE_PROVIDER_SITE_OTHER): Payer: Self-pay

## 2010-12-30 DIAGNOSIS — J309 Allergic rhinitis, unspecified: Secondary | ICD-10-CM

## 2010-12-31 NOTE — Telephone Encounter (Signed)
lmtcb

## 2010-12-31 NOTE — Telephone Encounter (Signed)
Xolair will arrive here on Wed. 01/07/11. Please schedule appointment for pt to receive his first xolair injection. Pt is aware that first injection, he will need to wait 2 hours after injection. Pt was advised to bring his epi pen with him to each xolair injection appointment. Please call patient with appointment date and time. Thanks.

## 2011-01-01 NOTE — Telephone Encounter (Signed)
lmtcb

## 2011-01-05 NOTE — Telephone Encounter (Signed)
Spoke with patient-he stated that he is aware of the Xolair arriving and states appt has been scheduled for 01-08-11 at 9am.

## 2011-01-08 ENCOUNTER — Ambulatory Visit (INDEPENDENT_AMBULATORY_CARE_PROVIDER_SITE_OTHER): Payer: Self-pay

## 2011-01-08 DIAGNOSIS — J45909 Unspecified asthma, uncomplicated: Secondary | ICD-10-CM

## 2011-01-09 MED ORDER — OMALIZUMAB 150 MG ~~LOC~~ SOLR
375.0000 mg | Freq: Once | SUBCUTANEOUS | Status: AC
  Administered 2011-01-09: 375 mg via SUBCUTANEOUS

## 2011-01-22 ENCOUNTER — Ambulatory Visit (INDEPENDENT_AMBULATORY_CARE_PROVIDER_SITE_OTHER): Payer: Self-pay

## 2011-01-22 DIAGNOSIS — J45909 Unspecified asthma, uncomplicated: Secondary | ICD-10-CM

## 2011-01-22 MED ORDER — OMALIZUMAB 150 MG ~~LOC~~ SOLR
375.0000 mg | Freq: Once | SUBCUTANEOUS | Status: AC
  Administered 2011-01-22: 375 mg via SUBCUTANEOUS

## 2011-01-28 ENCOUNTER — Ambulatory Visit (INDEPENDENT_AMBULATORY_CARE_PROVIDER_SITE_OTHER): Payer: Self-pay

## 2011-01-28 DIAGNOSIS — J309 Allergic rhinitis, unspecified: Secondary | ICD-10-CM

## 2011-02-03 ENCOUNTER — Ambulatory Visit (INDEPENDENT_AMBULATORY_CARE_PROVIDER_SITE_OTHER): Payer: Self-pay

## 2011-02-03 DIAGNOSIS — J309 Allergic rhinitis, unspecified: Secondary | ICD-10-CM

## 2011-02-05 ENCOUNTER — Other Ambulatory Visit: Payer: Self-pay | Admitting: Pulmonary Disease

## 2011-02-05 ENCOUNTER — Telehealth: Payer: Self-pay | Admitting: Pulmonary Disease

## 2011-02-05 ENCOUNTER — Ambulatory Visit (INDEPENDENT_AMBULATORY_CARE_PROVIDER_SITE_OTHER): Payer: Self-pay

## 2011-02-05 ENCOUNTER — Ambulatory Visit: Payer: Self-pay

## 2011-02-05 DIAGNOSIS — J45909 Unspecified asthma, uncomplicated: Secondary | ICD-10-CM

## 2011-02-05 MED ORDER — OMALIZUMAB 150 MG ~~LOC~~ SOLR
300.0000 mg | Freq: Once | SUBCUTANEOUS | Status: AC
  Administered 2011-02-05: 300 mg via SUBCUTANEOUS

## 2011-02-05 NOTE — Telephone Encounter (Signed)
Plan:  Finish Amoxicillin as directed .  follow up with Dr. Ezzard Standing as planned  Taper prednisone 20mg  daily for 5 days then  10mg  daily x 5 days and then 1/2 (5mg  ) daily for 5 days and stop'  follow up Dr. Vassie Loll In 1 month.  follow up Dr. Maple Hudson As planned in 2 months as planned  Please contact office for sooner follow up if symptoms do not improve or worsen or seek emergency care  The above are instructions from last ov with TP on 12/25/10.  Called and spoke with pt. He is asking when he is to stop prednisone. I went over instructions with him per last ov and advised that if eh followed these directions, he should have been done with pred by now.He states he has followed directions, but is still taking 20 mg per day and has been since last visit. He states that someone called and told him to do this. I see no record of this. He seems confused. CDY, pls advise, thanks!

## 2011-02-05 NOTE — Telephone Encounter (Signed)
Per CY as doc of day-Prednisone 1/2 of 20mg  daily x 7 days then 1/4 of 20mg  daily x 7 days, then stop if not wheezing.

## 2011-02-05 NOTE — Telephone Encounter (Signed)
Spoke with pt and notified of recs per CDY. Pt verbalized understanding and repeated the directions back to me correctly. Denied any further questions.

## 2011-02-06 MED ORDER — OMALIZUMAB 150 MG ~~LOC~~ SOLR
375.0000 mg | Freq: Once | SUBCUTANEOUS | Status: AC
  Administered 2011-02-06: 375 mg via SUBCUTANEOUS

## 2011-02-12 ENCOUNTER — Ambulatory Visit: Payer: Self-pay | Admitting: Pulmonary Disease

## 2011-02-12 ENCOUNTER — Ambulatory Visit (INDEPENDENT_AMBULATORY_CARE_PROVIDER_SITE_OTHER): Payer: Self-pay

## 2011-02-12 DIAGNOSIS — J309 Allergic rhinitis, unspecified: Secondary | ICD-10-CM

## 2011-02-16 ENCOUNTER — Other Ambulatory Visit (INDEPENDENT_AMBULATORY_CARE_PROVIDER_SITE_OTHER): Payer: Self-pay

## 2011-02-16 ENCOUNTER — Encounter: Payer: Self-pay | Admitting: Pulmonary Disease

## 2011-02-16 ENCOUNTER — Ambulatory Visit (INDEPENDENT_AMBULATORY_CARE_PROVIDER_SITE_OTHER): Payer: Self-pay | Admitting: Pulmonary Disease

## 2011-02-16 DIAGNOSIS — J45909 Unspecified asthma, uncomplicated: Secondary | ICD-10-CM

## 2011-02-16 DIAGNOSIS — J329 Chronic sinusitis, unspecified: Secondary | ICD-10-CM

## 2011-02-16 DIAGNOSIS — Z23 Encounter for immunization: Secondary | ICD-10-CM

## 2011-02-16 NOTE — Assessment & Plan Note (Signed)
Feel that this is a treatable trigger - defer to ENT if surgery will help.

## 2011-02-16 NOTE — Progress Notes (Signed)
  Subjective:    Patient ID: Mike Beck, male    DOB: 11/18/1975, 35 y.o.   MRN: [id number]  HPI PCP -Carolyne Fiscal  Allergy- Young   66 yoM former smoker followed for asthma, allergic rhinitis, eosinophilia, chronic sinusitis.  He had sneeze and rinitis in [place] before moving here in 2007.Marland Kitchen After 4 years here, he began having wheezing in July, 2010. It is unclear if symptoms relate to the job. He did not improve with a 2 month leave of absence from the job. Denies childhood respiratory disease .Wheeze, sneeze, runny nose, watery eyes and itching in throat are worse in Spring and Fall. OTC antihistamines are not very helpful.  Lab- Allergy Profile broadly elevated specific IgE w/ total IgE 1100.  - CT sinus 07/22/09- pansinusitis vs polyps  - CXR 04/14/10- mild bronchitis/ peribronchialthickening w/ resolution of pneumonia since 01/2010.  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 Quit  cigs 2010.   10/21/10-  Now at maintenance 1:50 on allergy vaccine as of 3/12. So far he hasn't appreciated a big difference. Spring pollen was bad this year and he was needing frequent prednisone. Shots are well tolerated with only trivial local itching. On prednisone now. Continues singulair. IgE was 717    02/16/2011 1 month follow-up. Patient complains of wheezing, itchy throat, pain down both of his legs, also leg cramps in his calfs and feet, pt states his breathing is better than last time but not 100%. He has been on pred since July On xolair q  2 weeks since 7/12 On allergy shots Since dec '11   Review of Systems Pt denies any significant  nasal congestion or excess secretions, fever, chills, sweats, unintended wt loss, pleuritic or exertional cp, orthopnea pnd or leg swelling.  Pt also denies any obvious fluctuation in symptoms with weather or environmental change or other alleviating or aggravating factors.    Pt denies any increase in  rescue therapy over baseline, denies waking up needing it or having early am exacerbations or coughing/wheezing/ or dyspnea      Objective:   Physical Exam Gen. Pleasant, well-nourished, in no distress ENT - no lesions, no post nasal drip Neck: No JVD, no thyromegaly, no carotid bruits Lungs: no use of accessory muscles, no dullness to percussion, clear without rales, faint rhonchi  On rt Cardiovascular: Rhythm regular, heart sounds  normal, no murmurs or gallops, no peripheral edema Musculoskeletal: No deformities, no cyanosis or clubbing         Assessment & Plan:

## 2011-02-16 NOTE — Assessment & Plan Note (Addendum)
Better controlled now, but I think this is due to prednisone rather than xolair Will taper gradually to 5 mg daily this month & qod in October - if flare will have to increase again - will tpaer to lowest effective dose, hopefully off. Check BMET today for cramps, sugars

## 2011-02-16 NOTE — Patient Instructions (Signed)
Stay on 5 mg prednisone daily for September In October, decrease to 5 mg on mon/wed/ fri only Stay on advair & singulair Flu shot Blood work Please keep appt with dr Ezzard Standing Keep taking xolair

## 2011-02-17 LAB — BASIC METABOLIC PANEL
Chloride: 108 mEq/L (ref 96–112)
GFR: 78.83 mL/min (ref >60.00)
Glucose, Bld: 89 mg/dL (ref 70–99)
Potassium: 4.2 mEq/L (ref 3.5–5.1)
Sodium: 143 mEq/L (ref 135–145)

## 2011-02-19 ENCOUNTER — Ambulatory Visit (INDEPENDENT_AMBULATORY_CARE_PROVIDER_SITE_OTHER): Payer: Self-pay

## 2011-02-19 DIAGNOSIS — J309 Allergic rhinitis, unspecified: Secondary | ICD-10-CM

## 2011-02-24 ENCOUNTER — Ambulatory Visit (INDEPENDENT_AMBULATORY_CARE_PROVIDER_SITE_OTHER): Payer: Self-pay | Admitting: Internal Medicine

## 2011-02-24 ENCOUNTER — Ambulatory Visit (INDEPENDENT_AMBULATORY_CARE_PROVIDER_SITE_OTHER): Payer: Self-pay

## 2011-02-24 ENCOUNTER — Encounter: Payer: Self-pay | Admitting: Internal Medicine

## 2011-02-24 VITALS — BP 112/68 | HR 66 | Ht 67.0 in | Wt 229.0 lb

## 2011-02-24 DIAGNOSIS — J45909 Unspecified asthma, uncomplicated: Secondary | ICD-10-CM

## 2011-02-24 DIAGNOSIS — J301 Allergic rhinitis due to pollen: Secondary | ICD-10-CM

## 2011-02-24 NOTE — Patient Instructions (Signed)
Sample Patenase nasal spray to add---- try 1-2 sprays each nostril twice daily if needed for sneezing/ sniffing  Continue both allergy shots and Xolair shots for now  Try to hold prednisone at 1/2 x 10 mg tab daily (=5 mg). If wheeze is getting worse, increase immediately to 15 mg daily and call if that doesn't fix it within a day or so.

## 2011-02-24 NOTE — Progress Notes (Signed)
Subjective:    Patient ID: Mike Beck, male    DOB: [birth year], 35 y.o.   MRN: [id number]  HPI HPI 10/21/10- 68 yoM former smoker followed for asthma, allergic rhinitis, eosinophilia, chronic sinusitis. Last here June 19, 2010 for allergy. He continues with Dr Vassie Loll for general pulmonary and sees me for allergy management. Now at maintenance 1:50 on allergy vaccine as of March 9. So far he hasn't appreciated a big difference. Spring pollen was bad this year and he was needing frequent prednisone. Shots are well tolerated with only trivial local itching. On prednisone now. Continues singulair. IgE was 717 when checked- off range for Xolair.   02/24/11- 34 yoM former smoker followed for asthma, allergic rhinitis, eosinophilia, chronic sinusitis. Now on both allergy vaccine and is Xolair. Allergy vaccine was advanced to our goal concentration of 1:10 in March. Today will be his fourth Xolair injection. He says he feels okay, a little bit of wheeze. He continues Advair 500, Singulair, prednisone 5 mg daily, Xyzal and Flonase. 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 He uses a respirator mask to mow, so we discussed using dust masks when he is working.  Review of Systems Review of Systems Constitutional:   No weight loss, night sweats,  Fevers, chills, fatigue, lassitude. HEENT:   No headaches,  Difficulty swallowing,  Tooth/dental problems,  Sore throat,  CV:  No chest pain,  Orthopnea, PND, swelling in lower extremities, anasarca, dizziness, palpitations GI  No heartburn, indigestion, abdominal pain, nausea, vomiting, diarrhea, change in bowel habits, loss of appetite Resp:  On Prednisone now:  No shortness of breath with exertion or at rest.  No excess mucus, no productive cough,  slight non-productive cough,  No coughing up of  blood.  No change in color of mucus.  + wheezing. Skin: no rash or lesions. GU: no dysuria, change in color of urine, no urgency or frequency.  No flank pain. MS:  No joint pain or swelling.  No decreased range of motion.  No back pain. Psych:  No change in mood or affect. No depression or anxiety.  No memory loss.    Objective:   Physical Exam General- Alert, Oriented, Affect-appropriate, Distress- none acute Skin- rash-none, lesions- none, excoriation- none Lymphadenopathy- none Head- atraumatic            Eyes- Gross vision intact, PERRLA, conjunctivae clear secretions            Ears- Hearing, canals-normal            Nose- Clear, no-Septal dev, + thin clear mucus, polyps, erosion, perforation; he looks clear but I note repeated watery sniffing.            Throat- Mallampati II , mucosa clear , drainage- none, tonsils- atrophic Neck- flexible , trachea midline, no stridor , thyroid nl, carotid no bruit Chest - symmetrical excursion , unlabored           Heart/CV- RRR , no murmur , no gallop  , no rub, nl s1 s2                           - JVD- none , edema- none, stasis changes- none, varices- none           Lung- +minimal unlabored wheeze,  cough- none , dullness-none, rub- none           Chest wall-  Abd- tender-no, distended-no, bowel sounds-present, HSM- no Br/ Gen/ Rectal- Not done, not indicated Extrem- cyanosis- none, clubbing, none, atrophy- none, strength- nl Neuro- grossly intact to observation     Assessment & Plan:   Subjective:   Objective:   Assessment:      Plan:

## 2011-02-25 DIAGNOSIS — J45909 Unspecified asthma, uncomplicated: Secondary | ICD-10-CM

## 2011-02-25 MED ORDER — OMALIZUMAB 150 MG ~~LOC~~ SOLR
375.0000 mg | Freq: Once | SUBCUTANEOUS | Status: AC
  Administered 2011-02-25: 375 mg via SUBCUTANEOUS

## 2011-02-26 ENCOUNTER — Ambulatory Visit (INDEPENDENT_AMBULATORY_CARE_PROVIDER_SITE_OTHER): Payer: Self-pay

## 2011-02-26 DIAGNOSIS — J309 Allergic rhinitis, unspecified: Secondary | ICD-10-CM

## 2011-02-26 NOTE — Progress Notes (Signed)
Quick Note:  Pt was here for vaccine and I informed pt of RA's recs and gave copy of same. Pt verbalized understanding  ______

## 2011-03-01 NOTE — Assessment & Plan Note (Addendum)
He is getting progressive anti-IgE therapy suggesting a different mechanism for his mild rhinitis. This may be vasomotor. It is not bothering him enough to take additional medication but we could try ipratropium if necessary. Try sample of Patenase which available here today.

## 2011-03-01 NOTE — Assessment & Plan Note (Signed)
At this point he is on high-dose inhaled steroid, low-dose oral maintenance steroid and the other medications as listed. He will be dependent on treatment beyond those directed at IgE mediated asthma. He understands to anticipate a 6 month trial of Xolair and a one-year trial of allergy vaccine. Talked again about avoidance of triggers and he will at least try using a dust mask for work.

## 2011-03-05 ENCOUNTER — Ambulatory Visit (INDEPENDENT_AMBULATORY_CARE_PROVIDER_SITE_OTHER): Payer: Self-pay

## 2011-03-05 DIAGNOSIS — J309 Allergic rhinitis, unspecified: Secondary | ICD-10-CM

## 2011-03-10 ENCOUNTER — Ambulatory Visit (INDEPENDENT_AMBULATORY_CARE_PROVIDER_SITE_OTHER): Payer: Self-pay

## 2011-03-10 DIAGNOSIS — J45909 Unspecified asthma, uncomplicated: Secondary | ICD-10-CM

## 2011-03-11 DIAGNOSIS — J45909 Unspecified asthma, uncomplicated: Secondary | ICD-10-CM

## 2011-03-11 MED ORDER — OMALIZUMAB 150 MG ~~LOC~~ SOLR
375.0000 mg | Freq: Once | SUBCUTANEOUS | Status: AC
  Administered 2011-03-11: 375 mg via SUBCUTANEOUS

## 2011-03-12 ENCOUNTER — Ambulatory Visit (INDEPENDENT_AMBULATORY_CARE_PROVIDER_SITE_OTHER): Payer: Self-pay

## 2011-03-12 DIAGNOSIS — J309 Allergic rhinitis, unspecified: Secondary | ICD-10-CM

## 2011-03-18 ENCOUNTER — Ambulatory Visit (INDEPENDENT_AMBULATORY_CARE_PROVIDER_SITE_OTHER): Payer: Self-pay

## 2011-03-18 ENCOUNTER — Ambulatory Visit: Payer: Self-pay | Admitting: Adult Health

## 2011-03-18 DIAGNOSIS — J309 Allergic rhinitis, unspecified: Secondary | ICD-10-CM

## 2011-03-24 ENCOUNTER — Ambulatory Visit (INDEPENDENT_AMBULATORY_CARE_PROVIDER_SITE_OTHER): Payer: Self-pay

## 2011-03-24 DIAGNOSIS — J45909 Unspecified asthma, uncomplicated: Secondary | ICD-10-CM

## 2011-03-25 DIAGNOSIS — J45909 Unspecified asthma, uncomplicated: Secondary | ICD-10-CM

## 2011-03-25 MED ORDER — OMALIZUMAB 150 MG ~~LOC~~ SOLR
375.0000 mg | Freq: Once | SUBCUTANEOUS | Status: AC
  Administered 2011-03-25: 375 mg via SUBCUTANEOUS

## 2011-03-26 ENCOUNTER — Ambulatory Visit (INDEPENDENT_AMBULATORY_CARE_PROVIDER_SITE_OTHER): Payer: Self-pay

## 2011-03-26 ENCOUNTER — Encounter: Payer: Self-pay | Admitting: Adult Health

## 2011-03-26 ENCOUNTER — Ambulatory Visit (INDEPENDENT_AMBULATORY_CARE_PROVIDER_SITE_OTHER): Payer: Self-pay | Admitting: Adult Health

## 2011-03-26 DIAGNOSIS — J309 Allergic rhinitis, unspecified: Secondary | ICD-10-CM

## 2011-03-26 DIAGNOSIS — J45909 Unspecified asthma, uncomplicated: Secondary | ICD-10-CM

## 2011-03-26 MED ORDER — RABEPRAZOLE SODIUM 20 MG PO TBEC: 20.0000 mg | DELAYED_RELEASE_TABLET | Freq: Every day | ORAL | Status: AC

## 2011-03-26 MED ORDER — PREDNISONE 10 MG PO TABS: ORAL_TABLET | ORAL | Status: DC

## 2011-03-26 MED ORDER — MONTELUKAST SODIUM 10 MG PO TABS: 10.0000 mg | ORAL_TABLET | Freq: Every day | ORAL | Status: DC

## 2011-03-26 MED ORDER — FLUTICASONE-SALMETEROL 500-50 MCG/DOSE IN AEPB: 1.0000 | INHALATION_SPRAY | Freq: Two times a day (BID) | RESPIRATORY_TRACT | Status: DC

## 2011-03-26 NOTE — Progress Notes (Signed)
Subjective:    Patient ID: Mike Beck, male    DOB: 1976-04-01, 35 y.o.   MRN: [id number]  HPI  HPI 10/21/10- 35 yoM former smoker followed for asthma, allergic rhinitis, eosinophilia, chronic sinusitis. Last here June 19, 2010 for allergy. He continues with Dr Vassie Loll for general pulmonary and sees me for allergy management. Now at maintenance 1:50 on allergy vaccine as of March 9. So far he hasn't appreciated a big difference. Spring pollen was bad this year and he was needing frequent prednisone. Shots are well tolerated with only trivial local itching. On prednisone now. Continues singulair. IgE was 717 when checked- off range for Xolair.   02/24/11- 35 yoM former smoker followed for asthma, allergic rhinitis, eosinophilia, chronic sinusitis. Now on both allergy vaccine and is Xolair. Allergy vaccine was advanced to our goal concentration of 1:10 in March. Today will be his fourth Xolair injection. He says he feels okay, a little bit of wheeze. He continues Advair 500, Singulair, prednisone 5 mg daily, Xyzal and Flonase. 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 He uses a respirator mask to mow, so we discussed using dust masks when he is working.  03/26/2011 Follow up  Pt returns for follow up . He is now on Xolair and allergies shots. Feels he is much better with increased activity tolerance and decreased asthma flare. Has been able to go to work without less missed work days due to asthma symtpoms Feels 90% better. No chest pain, edema or discolored mucus. Needs samples of advair, waiting on refills from [facility]. Currently on Prednisone 20mg . Misunderstood prednisone instructions and is now on 20mg  daily .    Review of Systems  Review of Systems Constitutional:   No weight loss, night sweats,  Fevers, chills,    +fatigue, lassitude. HEENT:   No headaches,  Difficulty swallowing,  Tooth/dental problems,  Sore throat,  CV:  No chest pain,  Orthopnea, PND, swelling in lower extremities, anasarca, dizziness, palpitations GI  No heartburn, indigestion, abdominal pain, nausea, vomiting, diarrhea, change in bowel habits, loss of appetite Resp:     No excess mucus, no productive cough,  slight non-productive cough,  No coughing up of blood.  No change in color of mucus  Skin: no rash or lesions. GU: no dysuria, change in color of urine, no urgency or frequency.  No flank pain. MS:  No joint pain or swelling.  No decreased range of motion.  No back pain. Psych:  No change in mood or affect. No depression or anxiety.  No memory loss.    Objective:   Physical Exam  General- Alert, Oriented, Affect-appropriate, Distress- none acute Skin- rash-none, lesions- none, excoriation- none Lymphadenopathy- none Head- atraumatic            Eyes- Gross vision intact, PERRLA, conjunctivae clear secretions            Ears- Hearing, canals-normal            Nose- Clear, no-Septal dev, + thin clear mucus, polyps, erosion, perforation; he looks clear but I note repeated watery sniffing.            Throat- Mallampati II , mucosa clear , drainage- none, tonsils- atrophic Neck- flexible , trachea midline, no stridor , thyroid nl, carotid no bruit Chest - symmetrical excursion , unlabored  Heart/CV- RRR , no murmur , no gallop  , no rub, nl s1 s2                           - JVD- none , edema- none, stasis changes- none, varices- none           Lung- +minimal unlabored exp wheeze, cough- none , dullness-none, rub- none           Chest wall-  Abd- tender-no, distended-no, bowel sounds-present, HSM- no Br/ Gen/ Rectal- Not done, not indicated Extrem- cyanosis- none, clubbing, none, atrophy- none, strength- nl Neuro- grossly intact to observation     Assessment & Plan:   Subjective:   Objective:   Assessment:       Plan:

## 2011-03-26 NOTE — Patient Instructions (Addendum)
Continue both allergy shots and Xolair shots for now  Decrease Prednisone 10mg  2 tabs daily alternate with 1 tab daily for 1 week then go 1 tab daily -hold at this dose   Follow up Dr. Vassie Loll  In 6 weeks and As needed

## 2011-03-27 NOTE — Assessment & Plan Note (Addendum)
Improved control with xolair, maintence regimen w/ Advair and allergy vaccines.  Want to taper slowly off steroids over next several weeks.   Plan:  Continue both allergy shots and Xolair shots for now  Decrease Prednisone 10mg  2 tabs daily alternate with 1 tab daily for 1 week then go 1 tab daily -hold at this dose   Follow up Dr. Vassie Loll  In 6 weeks and As needed

## 2011-04-02 ENCOUNTER — Ambulatory Visit (INDEPENDENT_AMBULATORY_CARE_PROVIDER_SITE_OTHER): Payer: Self-pay

## 2011-04-02 DIAGNOSIS — J309 Allergic rhinitis, unspecified: Secondary | ICD-10-CM

## 2011-04-07 ENCOUNTER — Ambulatory Visit (INDEPENDENT_AMBULATORY_CARE_PROVIDER_SITE_OTHER): Payer: Self-pay

## 2011-04-07 DIAGNOSIS — J45909 Unspecified asthma, uncomplicated: Secondary | ICD-10-CM

## 2011-04-08 DIAGNOSIS — J45909 Unspecified asthma, uncomplicated: Secondary | ICD-10-CM

## 2011-04-08 MED ORDER — OMALIZUMAB 150 MG ~~LOC~~ SOLR
375.0000 mg | Freq: Once | SUBCUTANEOUS | Status: AC
  Administered 2011-04-08: 375 mg via SUBCUTANEOUS

## 2011-04-09 ENCOUNTER — Ambulatory Visit (INDEPENDENT_AMBULATORY_CARE_PROVIDER_SITE_OTHER): Payer: Self-pay

## 2011-04-09 DIAGNOSIS — J309 Allergic rhinitis, unspecified: Secondary | ICD-10-CM

## 2011-04-21 ENCOUNTER — Ambulatory Visit (INDEPENDENT_AMBULATORY_CARE_PROVIDER_SITE_OTHER): Payer: Self-pay

## 2011-04-21 DIAGNOSIS — J45909 Unspecified asthma, uncomplicated: Secondary | ICD-10-CM

## 2011-04-23 ENCOUNTER — Ambulatory Visit (INDEPENDENT_AMBULATORY_CARE_PROVIDER_SITE_OTHER): Payer: Self-pay

## 2011-04-23 DIAGNOSIS — J309 Allergic rhinitis, unspecified: Secondary | ICD-10-CM

## 2011-04-26 DIAGNOSIS — J45909 Unspecified asthma, uncomplicated: Secondary | ICD-10-CM

## 2011-04-26 MED ORDER — OMALIZUMAB 150 MG ~~LOC~~ SOLR
375.0000 mg | Freq: Once | SUBCUTANEOUS | Status: AC
  Administered 2011-04-26: 375 mg via SUBCUTANEOUS

## 2011-05-01 ENCOUNTER — Ambulatory Visit (INDEPENDENT_AMBULATORY_CARE_PROVIDER_SITE_OTHER): Payer: Self-pay

## 2011-05-01 DIAGNOSIS — J309 Allergic rhinitis, unspecified: Secondary | ICD-10-CM

## 2011-05-05 ENCOUNTER — Ambulatory Visit: Payer: Self-pay

## 2011-05-06 ENCOUNTER — Ambulatory Visit (INDEPENDENT_AMBULATORY_CARE_PROVIDER_SITE_OTHER): Payer: Self-pay

## 2011-05-06 DIAGNOSIS — J45909 Unspecified asthma, uncomplicated: Secondary | ICD-10-CM

## 2011-05-06 MED ORDER — OMALIZUMAB 150 MG ~~LOC~~ SOLR
375.0000 mg | Freq: Once | SUBCUTANEOUS | Status: AC
  Administered 2011-05-06: 375 mg via SUBCUTANEOUS

## 2011-05-08 ENCOUNTER — Ambulatory Visit (INDEPENDENT_AMBULATORY_CARE_PROVIDER_SITE_OTHER): Payer: Self-pay

## 2011-05-08 DIAGNOSIS — J309 Allergic rhinitis, unspecified: Secondary | ICD-10-CM

## 2011-05-11 ENCOUNTER — Ambulatory Visit (INDEPENDENT_AMBULATORY_CARE_PROVIDER_SITE_OTHER): Payer: Self-pay | Admitting: Pulmonary Disease

## 2011-05-11 ENCOUNTER — Encounter: Payer: Self-pay | Admitting: Pulmonary Disease

## 2011-05-11 VITALS — BP 112/84 | HR 85 | Temp 98.7°F | Ht 67.0 in | Wt 233.8 lb

## 2011-05-11 DIAGNOSIS — J329 Chronic sinusitis, unspecified: Secondary | ICD-10-CM

## 2011-05-11 DIAGNOSIS — J45909 Unspecified asthma, uncomplicated: Secondary | ICD-10-CM

## 2011-05-11 MED ORDER — PREDNISONE 10 MG PO TABS: ORAL_TABLET | ORAL | Status: DC

## 2011-05-11 NOTE — Progress Notes (Signed)
  Subjective:    Patient ID: Mike Beck, male    DOB: [birth year], 35 y.o.   MRN: [id number]  HPI PCP -Carolyne Fiscal  Allergy- Young   72 yoM former smoker followed for asthma, allergic rhinitis, eosinophilia, chronic sinusitis.  He had sneeze and rinitis in [place] before moving here in 2007.Marland Kitchen After 4 years here, he began having wheezing in July, 2010. He did not improve with a 2 month leave of absence from the job. Denies childhood respiratory disease .Wheeze, sneeze, runny nose, watery eyes and itching in throat are worse in Spring and Fall. OTC antihistamines are not very helpful.  Lab- Allergy Profile broadly elevated specific IgE w/ total IgE 1100.  - CT sinus 07/22/09- pansinusitis vs polyps - has seen ENT - CXR 04/14/10- mild bronchitis/ peribronchialthickening w/ resolution of pneumonia since 01/2010.  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 Quit cigs 2010.  He has been on pred since July '12 On xolair q 2 weeks since 7/12  On allergy shots Since dec '11  05/11/2011 Attempted taper prednisone but was symptomatic on 10 mg & had to go back up to 20. He is overall better than he used to be - but I suspect only because he is still on prednisone. We discussed long term effects of prednisone. Again went through possible triggers at work & home - environmental control. He take tylenol prn but no NSAIds. Compliant with other meds incl singulair.     Review of Systems Patient denies significant dyspnea,cough, hemoptysis,  chest pain, palpitations, pedal edema, orthopnea, paroxysmal nocturnal dyspnea, lightheadedness, nausea, vomiting, abdominal or  leg pains      Objective:   Physical Exam  Gen. Pleasant, well-nourished, in no distress ENT - no lesions, no post nasal drip Neck: No JVD, no thyromegaly, no carotid bruits Lungs: no use of accessory muscles, no dullness to percussion, clear without rales or rhonchi    Cardiovascular: Rhythm regular, heart sounds  normal, no murmurs or gallops, no peripheral edema Musculoskeletal: No deformities, no cyanosis or clubbing        Assessment & Plan:

## 2011-05-11 NOTE — Patient Instructions (Signed)
DROP prednisone to 15 mg (1 1/2 tab) daily_ If you get sick, go back up to 20 mg Trial of spiriva -one puff daily GO back to ENT doctor DO NOT take Motrin, ADVIL etc - TYLENOL ok

## 2011-05-12 NOTE — Assessment & Plan Note (Signed)
DROP prednisone to 15 mg (1 1/2 tab) daily_ If you get sick, go back up to 20 mg Trial of spiriva -one puff daily given recent data about possible benefit with LAMA in asthma ENT re-evaluation for ? FESS DO NOT take Motrin, ADVIL etc - TYLENOL ok We discussed long term effects of steroids- I initiated a discussion about possibly moving from [place] back to [facility] if his symptoms do get worse.

## 2011-05-14 ENCOUNTER — Ambulatory Visit (INDEPENDENT_AMBULATORY_CARE_PROVIDER_SITE_OTHER): Payer: Self-pay

## 2011-05-14 DIAGNOSIS — J309 Allergic rhinitis, unspecified: Secondary | ICD-10-CM

## 2011-05-15 ENCOUNTER — Ambulatory Visit (INDEPENDENT_AMBULATORY_CARE_PROVIDER_SITE_OTHER): Payer: Self-pay

## 2011-05-15 DIAGNOSIS — J309 Allergic rhinitis, unspecified: Secondary | ICD-10-CM

## 2011-05-19 ENCOUNTER — Ambulatory Visit (INDEPENDENT_AMBULATORY_CARE_PROVIDER_SITE_OTHER): Payer: Self-pay

## 2011-05-19 DIAGNOSIS — J45909 Unspecified asthma, uncomplicated: Secondary | ICD-10-CM

## 2011-05-19 MED ORDER — OMALIZUMAB 150 MG ~~LOC~~ SOLR
375.0000 mg | Freq: Once | SUBCUTANEOUS | Status: AC
  Administered 2011-05-19: 375 mg via SUBCUTANEOUS

## 2011-05-20 ENCOUNTER — Ambulatory Visit: Payer: Self-pay

## 2011-05-28 ENCOUNTER — Ambulatory Visit (INDEPENDENT_AMBULATORY_CARE_PROVIDER_SITE_OTHER): Payer: Self-pay

## 2011-05-28 DIAGNOSIS — J309 Allergic rhinitis, unspecified: Secondary | ICD-10-CM

## 2011-06-01 ENCOUNTER — Ambulatory Visit (INDEPENDENT_AMBULATORY_CARE_PROVIDER_SITE_OTHER): Payer: Self-pay

## 2011-06-01 DIAGNOSIS — J45909 Unspecified asthma, uncomplicated: Secondary | ICD-10-CM

## 2011-06-04 DIAGNOSIS — J45909 Unspecified asthma, uncomplicated: Secondary | ICD-10-CM

## 2011-06-04 MED ORDER — OMALIZUMAB 150 MG ~~LOC~~ SOLR
375.0000 mg | Freq: Once | SUBCUTANEOUS | Status: AC
  Administered 2011-06-04: 375 mg via SUBCUTANEOUS

## 2011-06-05 ENCOUNTER — Ambulatory Visit (INDEPENDENT_AMBULATORY_CARE_PROVIDER_SITE_OTHER): Payer: Self-pay

## 2011-06-05 DIAGNOSIS — J309 Allergic rhinitis, unspecified: Secondary | ICD-10-CM

## 2011-06-11 ENCOUNTER — Ambulatory Visit (INDEPENDENT_AMBULATORY_CARE_PROVIDER_SITE_OTHER): Payer: Self-pay

## 2011-06-11 DIAGNOSIS — J309 Allergic rhinitis, unspecified: Secondary | ICD-10-CM

## 2011-06-15 ENCOUNTER — Ambulatory Visit (INDEPENDENT_AMBULATORY_CARE_PROVIDER_SITE_OTHER): Payer: Self-pay

## 2011-06-15 DIAGNOSIS — J45909 Unspecified asthma, uncomplicated: Secondary | ICD-10-CM

## 2011-06-16 DIAGNOSIS — J45909 Unspecified asthma, uncomplicated: Secondary | ICD-10-CM

## 2011-06-16 MED ORDER — OMALIZUMAB 150 MG ~~LOC~~ SOLR
300.0000 mg | Freq: Once | SUBCUTANEOUS | Status: AC
  Administered 2011-06-16: 300 mg via SUBCUTANEOUS

## 2011-06-19 ENCOUNTER — Ambulatory Visit (INDEPENDENT_AMBULATORY_CARE_PROVIDER_SITE_OTHER): Payer: Self-pay

## 2011-06-19 DIAGNOSIS — J309 Allergic rhinitis, unspecified: Secondary | ICD-10-CM

## 2011-06-25 ENCOUNTER — Ambulatory Visit (INDEPENDENT_AMBULATORY_CARE_PROVIDER_SITE_OTHER): Payer: Self-pay

## 2011-06-25 DIAGNOSIS — J309 Allergic rhinitis, unspecified: Secondary | ICD-10-CM

## 2011-06-29 ENCOUNTER — Ambulatory Visit (INDEPENDENT_AMBULATORY_CARE_PROVIDER_SITE_OTHER): Payer: Self-pay

## 2011-06-29 DIAGNOSIS — J45909 Unspecified asthma, uncomplicated: Secondary | ICD-10-CM

## 2011-07-01 DIAGNOSIS — J45909 Unspecified asthma, uncomplicated: Secondary | ICD-10-CM

## 2011-07-01 MED ORDER — OMALIZUMAB 150 MG ~~LOC~~ SOLR
375.0000 mg | Freq: Once | SUBCUTANEOUS | Status: AC
  Administered 2011-07-01: 375 mg via SUBCUTANEOUS

## 2011-07-09 ENCOUNTER — Encounter: Payer: Self-pay | Admitting: Internal Medicine

## 2011-07-10 ENCOUNTER — Ambulatory Visit (INDEPENDENT_AMBULATORY_CARE_PROVIDER_SITE_OTHER): Payer: Self-pay

## 2011-07-10 DIAGNOSIS — J309 Allergic rhinitis, unspecified: Secondary | ICD-10-CM

## 2011-07-13 ENCOUNTER — Encounter: Payer: Self-pay | Admitting: Adult Health

## 2011-07-13 ENCOUNTER — Ambulatory Visit (INDEPENDENT_AMBULATORY_CARE_PROVIDER_SITE_OTHER): Payer: Self-pay | Admitting: Adult Health

## 2011-07-13 ENCOUNTER — Ambulatory Visit (INDEPENDENT_AMBULATORY_CARE_PROVIDER_SITE_OTHER): Payer: Self-pay

## 2011-07-13 DIAGNOSIS — J45909 Unspecified asthma, uncomplicated: Secondary | ICD-10-CM

## 2011-07-13 MED ORDER — OMALIZUMAB 150 MG ~~LOC~~ SOLR
375.0000 mg | Freq: Once | SUBCUTANEOUS | Status: AC
  Administered 2011-07-13: 375 mg via SUBCUTANEOUS

## 2011-07-13 NOTE — Progress Notes (Signed)
Subjective:    Patient ID: Mike Beck, male    DOB: 01/08/1976, 36 y.o.   MRN: [id number]  HPI  PCP -Carolyne Fiscal  Allergy- Young   89 yoM former smoker followed for asthma, allergic rhinitis, eosinophilia, chronic sinusitis.  He had sneeze and rinitis in [place] before moving here in 2007.Marland Kitchen After 4 years here, he began having wheezing in July, 2010. He did not improve with a 2 month leave of absence from the job. Denies childhood respiratory disease .Wheeze, sneeze, runny nose, watery eyes and itching in throat are worse in Spring and Fall. OTC antihistamines are not very helpful.  Lab- Allergy Profile broadly elevated specific IgE w/ total IgE 1100.  - CT sinus 07/22/09- pansinusitis vs polyps - has seen ENT - CXR 04/14/10- mild bronchitis/ peribronchialthickening w/ resolution of pneumonia since 01/2010.  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 Quit cigs 2010.  He has been on pred since July '12 On xolair q 2 weeks since 7/12  On allergy shots Since dec '11  05/11/11  Attempted taper prednisone but was symptomatic on 10 mg & had to go back up to 20. He is overall better than he used to be - but I suspect only because he is still on prednisone. We discussed long term effects of prednisone. Again went through possible triggers at work & home - environmental control. He take tylenol prn but no NSAIds. Compliant with other meds incl singulair. >referred to ENT   07/13/2011 Follow up  Pt returns for follow up . Feels at his baseline. No flare in cough or wheezing. Has a lot of sinus congestion on/off. Did not see ENT . Unclear why this referral did not go thru. He is on community plan w/ [facility]. Get his rx thru their system.  Curently on 15mg  of prednisone. No flare of symptoms since last ov.  No fever or chest pain.     Review of Systems  Constitutional:   No  weight loss, night sweats,  Fevers, chills, fatigue, or   lassitude.  HEENT:   No headaches,  Difficulty swallowing,  Tooth/dental problems, or  Sore throat,                No sneezing, itching, ear ache,  +nasal congestion, post nasal drip,   CV:  No chest pain,  Orthopnea, PND, swelling in lower extremities, anasarca, dizziness, palpitations, syncope.   GI  No heartburn, indigestion, abdominal pain, nausea, vomiting, diarrhea, change in bowel habits, loss of appetite, bloody stools.   Resp:    No excess mucus, no productive cough,  No non-productive cough,  No coughing up of blood.  No change in color of mucus.  No wheezing.  No chest wall deformity  Skin: no rash or lesions.  GU: no dysuria, change in color of urine, no urgency or frequency.  No flank pain, no hematuria   MS:  No joint pain or swelling.  No decreased range of motion.  No back pain.  Psych:  No change in mood or affect. No depression or anxiety.  No memory loss.      Objective:   Physical Exam   GEN: A/Ox3; pleasant , NAD, overweight   HEENT:  [place]/AT,  EACs-clear, TMs-wnl, NOSE-clear drainage  THROAT-clear, no lesions, no postnasal drip or exudate noted.   NECK:  Supple w/ fair ROM; no JVD; normal carotid impulses w/o bruits; no thyromegaly or nodules palpated; no  lymphadenopathy.  RESP  Clear  P & A; w/o, wheezes/ rales/ or rhonchi.no accessory muscle use, no dullness to percussion  CARD:  RRR, no m/r/g  , no peripheral edema, pulses intact, no cyanosis or clubbing.  GI:   Soft & nt; nml bowel sounds; no organomegaly or masses detected.  Musco: Warm bil, no deformities or joint swelling noted.   Neuro: alert, no focal deficits noted.    Skin: Warm, no lesions or rashes        Assessment & Plan:

## 2011-07-13 NOTE — Patient Instructions (Addendum)
Decrease prednisone to 10 mg (1  tab) daily_ If you get sick, go back up to 15 mg Continue on Advair 1 puff Twice daily  (brush/rinse/gargle)  WE are looking into referral to ENT (sinus specialist )  follow up Dr. Vassie Loll  In 2 months and As needed

## 2011-07-13 NOTE — Assessment & Plan Note (Addendum)
Compensated on present regimen.  Want to get him back to see ENT to see if better control of sinus will help to decrease flare and hope to get off steroids As he is very young and chronic steroids is not ideal solution .  Will decrease prednisone very slowly  Plan:  Decrease prednisone to 10 mg (1  tab) daily_ If you get sick, go back up to 15 mg Continue on Advair 1 puff Twice daily  (brush/rinse/gargle)  WE are looking into referral to ENT (sinus specialist )  follow up Dr. Vassie Loll  In 2 months and As needed

## 2011-07-17 ENCOUNTER — Ambulatory Visit (INDEPENDENT_AMBULATORY_CARE_PROVIDER_SITE_OTHER): Payer: Self-pay

## 2011-07-17 DIAGNOSIS — J309 Allergic rhinitis, unspecified: Secondary | ICD-10-CM

## 2011-07-24 ENCOUNTER — Ambulatory Visit (INDEPENDENT_AMBULATORY_CARE_PROVIDER_SITE_OTHER): Payer: Self-pay

## 2011-07-24 DIAGNOSIS — J309 Allergic rhinitis, unspecified: Secondary | ICD-10-CM

## 2011-07-27 ENCOUNTER — Ambulatory Visit (INDEPENDENT_AMBULATORY_CARE_PROVIDER_SITE_OTHER): Payer: Self-pay

## 2011-07-27 ENCOUNTER — Telehealth: Payer: Self-pay | Admitting: Pulmonary Disease

## 2011-07-27 DIAGNOSIS — J45909 Unspecified asthma, uncomplicated: Secondary | ICD-10-CM

## 2011-07-27 MED ORDER — OMALIZUMAB 150 MG ~~LOC~~ SOLR
375.0000 mg | Freq: Once | SUBCUTANEOUS | Status: AC
  Administered 2011-07-27: 375 mg via SUBCUTANEOUS

## 2011-07-27 MED ORDER — PREDNISONE 10 MG PO TABS: ORAL_TABLET | ORAL | Status: DC

## 2011-07-27 NOTE — Telephone Encounter (Signed)
RX for prednisone has been sent to [facility]--lmomtcb x1 for pt make aware

## 2011-07-28 NOTE — Telephone Encounter (Signed)
lmomtcb  

## 2011-07-29 NOTE — Telephone Encounter (Signed)
lmomtcb  

## 2011-07-30 NOTE — Telephone Encounter (Signed)
LMOM for the pt to be made aware that the rx he requested was sent to pharm and needs to call back if something further needed.

## 2011-08-07 ENCOUNTER — Ambulatory Visit (INDEPENDENT_AMBULATORY_CARE_PROVIDER_SITE_OTHER): Payer: Self-pay

## 2011-08-07 DIAGNOSIS — J309 Allergic rhinitis, unspecified: Secondary | ICD-10-CM

## 2011-08-10 ENCOUNTER — Ambulatory Visit (INDEPENDENT_AMBULATORY_CARE_PROVIDER_SITE_OTHER): Payer: Self-pay

## 2011-08-10 DIAGNOSIS — J45909 Unspecified asthma, uncomplicated: Secondary | ICD-10-CM

## 2011-08-11 DIAGNOSIS — J45909 Unspecified asthma, uncomplicated: Secondary | ICD-10-CM

## 2011-08-11 MED ORDER — OMALIZUMAB 150 MG ~~LOC~~ SOLR
375.0000 mg | Freq: Once | SUBCUTANEOUS | Status: AC
  Administered 2011-08-11: 375 mg via SUBCUTANEOUS

## 2011-08-14 ENCOUNTER — Ambulatory Visit (INDEPENDENT_AMBULATORY_CARE_PROVIDER_SITE_OTHER): Payer: Self-pay

## 2011-08-14 DIAGNOSIS — J309 Allergic rhinitis, unspecified: Secondary | ICD-10-CM

## 2011-08-21 ENCOUNTER — Ambulatory Visit (INDEPENDENT_AMBULATORY_CARE_PROVIDER_SITE_OTHER): Payer: Self-pay

## 2011-08-21 DIAGNOSIS — J309 Allergic rhinitis, unspecified: Secondary | ICD-10-CM

## 2011-08-25 ENCOUNTER — Ambulatory Visit (INDEPENDENT_AMBULATORY_CARE_PROVIDER_SITE_OTHER): Payer: Self-pay

## 2011-08-25 ENCOUNTER — Ambulatory Visit (INDEPENDENT_AMBULATORY_CARE_PROVIDER_SITE_OTHER): Payer: Self-pay | Admitting: Internal Medicine

## 2011-08-25 ENCOUNTER — Encounter: Payer: Self-pay | Admitting: Internal Medicine

## 2011-08-25 VITALS — BP 132/80 | HR 70 | Ht 67.0 in | Wt 233.8 lb

## 2011-08-25 DIAGNOSIS — J45909 Unspecified asthma, uncomplicated: Secondary | ICD-10-CM

## 2011-08-25 DIAGNOSIS — J329 Chronic sinusitis, unspecified: Secondary | ICD-10-CM

## 2011-08-25 DIAGNOSIS — IMO0002 Reserved for concepts with insufficient information to code with codable children: Secondary | ICD-10-CM

## 2011-08-25 NOTE — Progress Notes (Signed)
Patient ID: Mike Beck, male    DOB: [birth year], 36 y.o.   MRN: [id number] PCP -Carolyne Fiscal  Allergy- Tevion Laforge   02/24/11- HPI- 36 yoM former smoker followed for asthma, allergic rhinitis, eosinophilia, chronic sinusitis.  He had sneeze and rinitis in [place] before moving here in 2007.Marland Kitchen After 4 years here, he began having wheezing in July, 2010. He did not improve with a 2 month leave of absence from the job. Denies childhood respiratory disease .Wheeze, sneeze, runny nose, watery eyes and itching in throat are worse in Spring and Fall. OTC antihistamines are not very helpful.  Lab- Allergy Profile broadly elevated specific IgE w/ total IgE 1100.  - CT sinus 07/22/09- pansinusitis vs polyps - has seen ENT - CXR 04/14/10- mild bronchitis/ peribronchialthickening w/ resolution of pneumonia since 01/2010.  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 Quit cigs 2010.  He has been on pred since July '12 On xolair q 2 weeks since 7/12  On allergy shots Since dec '11  05/11/11  Attempted taper prednisone but was symptomatic on 10 mg & had to go back up to 20. He is overall better than he used to be - but I suspect only because he is still on prednisone. We discussed long term effects of prednisone. Again went through possible triggers at work & home - environmental control. He take tylenol prn but no NSAIds. Compliant with other meds incl singulair. >referred to ENT   07/13/2011 Follow up  Pt returns for follow up . Feels at his baseline. No flare in cough or wheezing. Has a lot of sinus congestion on/off. Did not see ENT . Unclear why this referral did not go thru. He is on community plan w/ [facility]. Get his rx thru their system.  Curently on 15mg  of prednisone. No flare of symptoms since last ov.  No fever or chest pain.   08/25/11- Allergy F/U-36 yoM former smoker followed for asthma, allergic rhinitis, eosinophilia, chronic sinusitis.    And continues both Xolair and allergy vaccine along with prednisone 10 mg tabs taking one or 2 daily. He also continues Advair 500 mg twice a day. He feels all of these are helpful and permitted him to keep working. Using his nebulizer about twice a day, Combivent 0-2 times a day, about 3 times per week. We again reviewed his history of asthma began 3 years ago. He had ENT evaluation with discussing of surgery for polyps. Denies history of aspirin allergy. Running causes low back pain. I discussed steroid side effects, especially on bone including aseptic necrosis and vertebral compression fracture. Office Spirometry- 08/25/11- mild obstructive airways disease, small airways. FVC 3.94/82%, FEV1 2.79/70%, FEV1/FVC 0.71,  FEF 25-75% 1.94/46%.  ROS-see HPI Constitutional:   No-   weight loss, night sweats, fevers, chills, fatigue, lassitude. HEENT:   No-  headaches, difficulty swallowing, tooth/dental problems, sore throat,       No-  sneezing, itching, ear ache, nasal congestion, post nasal drip,  CV:  No-   chest pain, orthopnea, PND, swelling in lower extremities, anasarca, dizziness, palpitations Resp: No-   shortness of breath with exertion or at rest.              No-   productive cough,  No non-productive cough,  No- coughing up of blood.              No-   change in color of mucus.  Little wheezing.   Skin: No-   rash or lesions. GI:  No-   heartburn, indigestion, abdominal pain, nausea, vomiting, GU:  MS:  No-   joint pain or swelling.  No- decreased range of motion.  + back pain with running Neuro-     nothing unusual Psych:  No- change in mood or affect. No depression or anxiety.  No memory loss.   OBJ- Physical Exam General- Alert, Oriented, Affect-appropriate, Distress- none acute Skin- rash-none, lesions- none, excoriation- none Lymphadenopathy- none Head- atraumatic            Eyes- Gross vision intact, PERRLA, conjunctivae and secretions clear            Ears- Hearing,  canals-normal            Nose- Clear, no-Septal dev, mucus, polyps, erosion, perforation             Throat- Mallampati II , mucosa clear , drainage- none, tonsils- atrophic Neck- flexible , trachea midline, no stridor , thyroid nl, carotid no bruit Chest - symmetrical excursion , unlabored           Heart/CV- RRR , no murmur , no gallop  , no rub, nl s1 s2                           - JVD- none , edema- none, stasis changes- none, varices- none           Lung- clear to P&A, wheeze- none, cough- none , dullness-none, rub- none           Chest wall-  Abd-  Br/ Gen/ Rectal- Not done, not indicated Extrem- cyanosis- none, clubbing, none, atrophy- none, strength- nl Neuro- grossly intact to observation

## 2011-08-25 NOTE — Patient Instructions (Signed)
Continue present meds  Order- DEXA bone density scan   Dx    Chronic steroid therapy

## 2011-08-26 DIAGNOSIS — J45909 Unspecified asthma, uncomplicated: Secondary | ICD-10-CM

## 2011-08-26 MED ORDER — OMALIZUMAB 150 MG ~~LOC~~ SOLR
375.0000 mg | Freq: Once | SUBCUTANEOUS | Status: AC
  Administered 2011-08-26: 375 mg via SUBCUTANEOUS

## 2011-08-28 ENCOUNTER — Ambulatory Visit (INDEPENDENT_AMBULATORY_CARE_PROVIDER_SITE_OTHER)
Admission: RE | Admit: 2011-08-28 | Discharge: 2011-08-28 | Disposition: A | Discharge status: Home / Self Care | Payer: Self-pay | Source: Ambulatory Visit

## 2011-08-28 ENCOUNTER — Ambulatory Visit (INDEPENDENT_AMBULATORY_CARE_PROVIDER_SITE_OTHER): Payer: Self-pay

## 2011-08-28 DIAGNOSIS — J309 Allergic rhinitis, unspecified: Secondary | ICD-10-CM

## 2011-08-28 DIAGNOSIS — IMO0002 Reserved for concepts with insufficient information to code with codable children: Secondary | ICD-10-CM

## 2011-08-28 NOTE — Assessment & Plan Note (Signed)
He thinks he has been told he has nasal polyps. I do not see them on exam today with my equipment.

## 2011-08-28 NOTE — Assessment & Plan Note (Signed)
He is on an extremely aggressive regimen including oral and inhaled steroids as well as Xolair and allergy vaccine. Something related to his beginning to work in this [place] seems to have been an important trigger. We discussed steroid side effects. Plan-bone density assessment. Educated about changing delivery device for his Combivent inhaler.

## 2011-09-08 ENCOUNTER — Ambulatory Visit: Payer: Self-pay

## 2011-09-09 ENCOUNTER — Ambulatory Visit (INDEPENDENT_AMBULATORY_CARE_PROVIDER_SITE_OTHER): Payer: Self-pay

## 2011-09-09 DIAGNOSIS — J45909 Unspecified asthma, uncomplicated: Secondary | ICD-10-CM

## 2011-09-10 DIAGNOSIS — J45909 Unspecified asthma, uncomplicated: Secondary | ICD-10-CM

## 2011-09-10 MED ORDER — OMALIZUMAB 150 MG ~~LOC~~ SOLR
375.0000 mg | Freq: Once | SUBCUTANEOUS | Status: AC
  Administered 2011-09-10: 375 mg via SUBCUTANEOUS

## 2011-09-11 ENCOUNTER — Ambulatory Visit (INDEPENDENT_AMBULATORY_CARE_PROVIDER_SITE_OTHER): Payer: Self-pay

## 2011-09-11 DIAGNOSIS — J309 Allergic rhinitis, unspecified: Secondary | ICD-10-CM

## 2011-09-14 ENCOUNTER — Ambulatory Visit: Payer: Self-pay | Admitting: Pulmonary Disease

## 2011-09-18 ENCOUNTER — Ambulatory Visit (INDEPENDENT_AMBULATORY_CARE_PROVIDER_SITE_OTHER): Payer: Self-pay | Admitting: Pulmonary Disease

## 2011-09-18 ENCOUNTER — Ambulatory Visit (INDEPENDENT_AMBULATORY_CARE_PROVIDER_SITE_OTHER): Payer: Self-pay

## 2011-09-18 ENCOUNTER — Encounter: Payer: Self-pay | Admitting: Pulmonary Disease

## 2011-09-18 VITALS — BP 118/78 | HR 76 | Temp 98.5°F | Ht 68.0 in | Wt 234.4 lb

## 2011-09-18 DIAGNOSIS — J309 Allergic rhinitis, unspecified: Secondary | ICD-10-CM

## 2011-09-18 DIAGNOSIS — J45909 Unspecified asthma, uncomplicated: Secondary | ICD-10-CM

## 2011-09-18 MED ORDER — PREDNISONE 5 MG PO TABS: 5.0000 mg | ORAL_TABLET | Freq: Every day | ORAL | Status: AC

## 2011-09-18 MED ORDER — PREDNISONE 10 MG PO TABS: ORAL_TABLET | ORAL | Status: DC

## 2011-09-18 NOTE — Patient Instructions (Signed)
Send refills for pred 10 & 5mg  x 60 tabs x 2 refills Take 20 mg daily On may 1 st, drop to 15 mg daily On may 15 th drop to 10 mg daily If symptoms worse, increase dose by 10 mg  X 3-4 days until better

## 2011-09-18 NOTE — Progress Notes (Signed)
  Subjective:    Patient ID: Mike Beck, male    DOB: Feb 08, 1976, 36 y.o.   MRN: [id number]  HPI PCP -Carolyne Fiscal  Allergy- Young   36 yoM former smoker followed for asthma, allergic rhinitis, eosinophilia, chronic sinusitis -initial ov 9/11  He had  rhinitis in [place] before moving here in 2007.Marland Kitchen After 4 years here, he began having wheezing in July, 2010. He did not improve with a 2 month leave of absence from the job. Denies childhood respiratory disease .Wheeze, sneeze, runny nose, watery eyes and itching in throat are worse in Spring and Fall. OTC antihistamines are not very helpful.  Allergy Profile broadly elevated specific IgE w/ total IgE 1100.  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 Quit cigs 2010.  He has been on pred since July '12, 10-20 mg  On xolair q 2 weeks since 7/12  On allergy shots Since dec '11  On  Advair 500 mg twice a day He had ENT evaluation with discussion of surgery for polyps. Denies history of aspirin allergy.  Spirometry- 08/25/11- mild obstructive airways disease, small airways. FVC 3.94/82%, FEV1 2.79/70%, FEV1/FVC 0.71, FEF 25-75% 1.94/46%.  09/18/2011 Was down to 10 mg pred until 4 ds ago, increased to 30 mg for 'pollen' related Wheezing  He feels all meds are helpful and permitted him to keep working. Using his nebulizer about twice a day, Combivent 0-2 times a day, about 3 times per week. Running causes low back pain - bone scan neg.    Review of Systems Patient denies significant dyspnea,cough, hemoptysis,  chest pain, palpitations, pedal edema, orthopnea, paroxysmal nocturnal dyspnea, lightheadedness, nausea, vomiting, abdominal or  leg pains      Objective:   Physical Exam  Gen. Pleasant, well-nourished, in no distress ENT - no lesions, no post nasal drip Neck: No JVD, no thyromegaly, no carotid bruits Lungs: no  use of accessory muscles, no dullness to percussion, clear without rales or rhonchi  Cardiovascular: Rhythm regular, heart sounds  normal, no murmurs or gallops, no peripheral edema Musculoskeletal: No deformities, no cyanosis or clubbing        Assessment & Plan:

## 2011-09-18 NOTE — Assessment & Plan Note (Addendum)
He has flares inspite of maximal therapy - will keep him on prednisone & accept long term side effects as the cost of keeping him functional & in the workforce. I have discussed other options including ENT surgery & perhaps relocation to a different place with him He will stay on 20 mg prednisone for now On may 1 st, drop to 15 mg daily On may 15 th drop to 10 mg daily If symptoms worse, increase dose by 10 mg  X 3-4 days until better

## 2011-09-22 ENCOUNTER — Ambulatory Visit (INDEPENDENT_AMBULATORY_CARE_PROVIDER_SITE_OTHER): Payer: Self-pay

## 2011-09-22 DIAGNOSIS — J45909 Unspecified asthma, uncomplicated: Secondary | ICD-10-CM

## 2011-09-22 MED ORDER — OMALIZUMAB 150 MG ~~LOC~~ SOLR
375.0000 mg | Freq: Once | SUBCUTANEOUS | Status: AC
  Administered 2011-09-22: 375 mg via SUBCUTANEOUS

## 2011-09-25 ENCOUNTER — Ambulatory Visit (INDEPENDENT_AMBULATORY_CARE_PROVIDER_SITE_OTHER): Payer: Self-pay

## 2011-09-25 DIAGNOSIS — J309 Allergic rhinitis, unspecified: Secondary | ICD-10-CM

## 2011-10-06 ENCOUNTER — Ambulatory Visit (INDEPENDENT_AMBULATORY_CARE_PROVIDER_SITE_OTHER): Payer: Self-pay

## 2011-10-06 DIAGNOSIS — J45909 Unspecified asthma, uncomplicated: Secondary | ICD-10-CM

## 2011-10-07 MED ORDER — OMALIZUMAB 150 MG ~~LOC~~ SOLR
375.0000 mg | Freq: Once | SUBCUTANEOUS | Status: AC
  Administered 2011-10-07: 375 mg via SUBCUTANEOUS

## 2011-10-16 ENCOUNTER — Ambulatory Visit (INDEPENDENT_AMBULATORY_CARE_PROVIDER_SITE_OTHER): Payer: Self-pay

## 2011-10-16 DIAGNOSIS — J309 Allergic rhinitis, unspecified: Secondary | ICD-10-CM

## 2011-10-20 ENCOUNTER — Ambulatory Visit (INDEPENDENT_AMBULATORY_CARE_PROVIDER_SITE_OTHER): Payer: Self-pay

## 2011-10-20 DIAGNOSIS — J45909 Unspecified asthma, uncomplicated: Secondary | ICD-10-CM

## 2011-10-21 DIAGNOSIS — J45909 Unspecified asthma, uncomplicated: Secondary | ICD-10-CM

## 2011-10-21 MED ORDER — OMALIZUMAB 150 MG ~~LOC~~ SOLR
375.0000 mg | Freq: Once | SUBCUTANEOUS | Status: AC
  Administered 2011-10-21: 375 mg via SUBCUTANEOUS

## 2011-11-03 ENCOUNTER — Ambulatory Visit (INDEPENDENT_AMBULATORY_CARE_PROVIDER_SITE_OTHER): Payer: Self-pay

## 2011-11-03 DIAGNOSIS — J45909 Unspecified asthma, uncomplicated: Secondary | ICD-10-CM

## 2011-11-03 MED ORDER — OMALIZUMAB 150 MG ~~LOC~~ SOLR
375.0000 mg | Freq: Once | SUBCUTANEOUS | Status: AC
  Administered 2011-11-03: 375 mg via SUBCUTANEOUS

## 2011-11-06 ENCOUNTER — Ambulatory Visit (INDEPENDENT_AMBULATORY_CARE_PROVIDER_SITE_OTHER): Payer: Self-pay

## 2011-11-06 DIAGNOSIS — J309 Allergic rhinitis, unspecified: Secondary | ICD-10-CM

## 2011-11-16 ENCOUNTER — Telehealth: Payer: Self-pay | Admitting: Pulmonary Disease

## 2011-11-16 MED ORDER — PREDNISONE 10 MG PO TABS: ORAL_TABLET | ORAL | Status: DC

## 2011-11-16 MED ORDER — PREDNISONE 5 MG PO TABS: ORAL_TABLET | ORAL | Status: DC

## 2011-11-16 NOTE — Telephone Encounter (Signed)
Left a detailed msg on pt's CM so he is aware refills have been sent to his pharmacy and to call the pharmacy to make sure they have this ready before going to pick this up. Pt may callback if he has any further questions regarding his medications.

## 2011-11-17 ENCOUNTER — Ambulatory Visit (INDEPENDENT_AMBULATORY_CARE_PROVIDER_SITE_OTHER): Payer: Self-pay

## 2011-11-17 DIAGNOSIS — J45909 Unspecified asthma, uncomplicated: Secondary | ICD-10-CM

## 2011-11-17 MED ORDER — OMALIZUMAB 150 MG ~~LOC~~ SOLR
375.0000 mg | Freq: Once | SUBCUTANEOUS | Status: AC
  Administered 2011-11-17: 375 mg via SUBCUTANEOUS

## 2011-11-20 ENCOUNTER — Ambulatory Visit (INDEPENDENT_AMBULATORY_CARE_PROVIDER_SITE_OTHER): Payer: Self-pay

## 2011-11-20 DIAGNOSIS — J309 Allergic rhinitis, unspecified: Secondary | ICD-10-CM

## 2011-11-27 ENCOUNTER — Ambulatory Visit (INDEPENDENT_AMBULATORY_CARE_PROVIDER_SITE_OTHER): Payer: Self-pay

## 2011-11-27 DIAGNOSIS — J309 Allergic rhinitis, unspecified: Secondary | ICD-10-CM

## 2011-11-30 ENCOUNTER — Encounter: Payer: Self-pay | Admitting: Internal Medicine

## 2011-12-01 ENCOUNTER — Ambulatory Visit (INDEPENDENT_AMBULATORY_CARE_PROVIDER_SITE_OTHER): Payer: Self-pay

## 2011-12-01 DIAGNOSIS — J45909 Unspecified asthma, uncomplicated: Secondary | ICD-10-CM

## 2011-12-02 MED ORDER — OMALIZUMAB 150 MG ~~LOC~~ SOLR
375.0000 mg | Freq: Once | SUBCUTANEOUS | Status: AC
  Administered 2011-12-02: 375 mg via SUBCUTANEOUS

## 2011-12-03 ENCOUNTER — Ambulatory Visit (INDEPENDENT_AMBULATORY_CARE_PROVIDER_SITE_OTHER): Payer: Self-pay

## 2011-12-03 ENCOUNTER — Ambulatory Visit (INDEPENDENT_AMBULATORY_CARE_PROVIDER_SITE_OTHER): Payer: Self-pay | Admitting: Pulmonary Disease

## 2011-12-03 ENCOUNTER — Encounter: Payer: Self-pay | Admitting: Pulmonary Disease

## 2011-12-03 VITALS — BP 106/60 | HR 91 | Temp 98.1°F | Ht 67.0 in | Wt 226.2 lb

## 2011-12-03 DIAGNOSIS — J45909 Unspecified asthma, uncomplicated: Secondary | ICD-10-CM

## 2011-12-03 DIAGNOSIS — J309 Allergic rhinitis, unspecified: Secondary | ICD-10-CM

## 2011-12-03 NOTE — Patient Instructions (Signed)
Let me know if heartburn worse Decrease to 10 mg prednisone on aug 1st Good idea about the dog  & the job change

## 2011-12-03 NOTE — Progress Notes (Signed)
  Subjective:    Patient ID: Mike Beck, male    DOB: 06/19/1975, 36 y.o.   MRN: [id number]  HPI PCP -Carolyne Fiscal  Allergy- Young   35 yoM former smoker followed for asthma, allergic rhinitis, eosinophilia, chronic sinusitis -initial ov 9/11  He had rhinitis in [place] before moving here in 2007.Marland Kitchen After 4 years here, he began having wheezing in July, 2010. He did not improve with a 2 month leave of absence from the job. Denies childhood respiratory disease .Wheeze, sneeze, runny nose, watery eyes and itching in throat are worse in Spring and Fall. OTC antihistamines are not very helpful.  Allergy Profile broadly elevated specific IgE w/ total IgE 1100.  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 Quit cigs 2010.  He has been on pred since July '12, 10-20 mg  On xolair q 2 weeks since 7/12  On allergy shots Since dec '11  On Advair 500 mg twice a day  He had ENT evaluation with discussion of surgery for polyps. Denies history of aspirin allergy. Spirometry- 08/25/11- mild obstructive airways disease, small airways. FVC 3.94/82%, FEV1 2.79/70%, FEV1/FVC 0.71, FEF 25-75% 1.94/46%.   09/18/2011 Running causes low back pain - bone scan neg.   12/03/2011 On 15 mg pred, last used neb 1 week ago, uses rescue mdi 2-3 times/d. 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 Has a dog in the yard that he may have to give away - even though his children will be upset. No nocturnal symptoms. Had increased symps when down to 10 mg in 4/13.  Mild clear phlegm   Review of Systems Pt denies any significant  nasal congestion or excess secretions, fever, chills, sweats, unintended wt loss, pleuritic or exertional cp, orthopnea pnd or leg swelling.  Pt also denies any obvious fluctuation in symptoms with weather or environmental change or other alleviating or  aggravating factors.    Pt denies any increase in rescue therapy over baseline, denies waking up needing it or having early am exacerbations or coughing/wheezing/ or dyspnea      Objective:   Physical Exam Gen. Pleasant, well-nourished, in no distress ENT - no lesions, no post nasal drip Neck: No JVD, no thyromegaly, no carotid bruits Lungs: no use of accessory muscles, no dullness to percussion, clear without rales or rhonchi  Cardiovascular: Rhythm regular, heart sounds  normal, no murmurs or gallops, no peripheral edema Musculoskeletal: No deformities, no cyanosis or clubbing         Assessment & Plan:

## 2011-12-04 NOTE — Assessment & Plan Note (Signed)
He has flares inspite of maximal therapy - will keep him on prednisone & accept long term side effects as the cost of keeping him functional & in the workforce. I have discussed other options including ENT surgery & perhaps relocation to a different place with him He will stay on 15 mg prednisone for now & drop to 10 mg in august _ I would be hesitant to drop him further If symptoms worse, increase dose by 10 mg  X 3-4 days until better Giving the dog away & change of job are options on the table Ct xolair & allergy shots

## 2011-12-15 ENCOUNTER — Ambulatory Visit (INDEPENDENT_AMBULATORY_CARE_PROVIDER_SITE_OTHER): Payer: Self-pay

## 2011-12-15 DIAGNOSIS — J45909 Unspecified asthma, uncomplicated: Secondary | ICD-10-CM

## 2011-12-15 MED ORDER — OMALIZUMAB 150 MG ~~LOC~~ SOLR
375.0000 mg | Freq: Once | SUBCUTANEOUS | Status: AC
  Administered 2011-12-15: 375 mg via SUBCUTANEOUS

## 2011-12-16 ENCOUNTER — Ambulatory Visit: Payer: Self-pay

## 2011-12-22 ENCOUNTER — Ambulatory Visit (INDEPENDENT_AMBULATORY_CARE_PROVIDER_SITE_OTHER): Payer: Self-pay

## 2011-12-22 DIAGNOSIS — J309 Allergic rhinitis, unspecified: Secondary | ICD-10-CM

## 2011-12-24 ENCOUNTER — Telehealth: Payer: Self-pay | Admitting: Pulmonary Disease

## 2011-12-24 MED ORDER — PREDNISONE 10 MG PO TABS: ORAL_TABLET | ORAL | Status: DC

## 2011-12-24 NOTE — Telephone Encounter (Signed)
Spoke with pt requesting rx for prednisone rx sent nothing further was needed.

## 2011-12-29 ENCOUNTER — Ambulatory Visit (INDEPENDENT_AMBULATORY_CARE_PROVIDER_SITE_OTHER): Payer: Self-pay

## 2011-12-29 DIAGNOSIS — J45909 Unspecified asthma, uncomplicated: Secondary | ICD-10-CM

## 2011-12-29 MED ORDER — OMALIZUMAB 150 MG ~~LOC~~ SOLR
375.0000 mg | Freq: Once | SUBCUTANEOUS | Status: AC
  Administered 2011-12-29: 375 mg via SUBCUTANEOUS

## 2012-01-05 ENCOUNTER — Ambulatory Visit: Payer: Self-pay

## 2012-01-08 ENCOUNTER — Ambulatory Visit (INDEPENDENT_AMBULATORY_CARE_PROVIDER_SITE_OTHER): Payer: Self-pay

## 2012-01-08 DIAGNOSIS — J309 Allergic rhinitis, unspecified: Secondary | ICD-10-CM

## 2012-01-12 ENCOUNTER — Ambulatory Visit (INDEPENDENT_AMBULATORY_CARE_PROVIDER_SITE_OTHER): Payer: Self-pay

## 2012-01-12 DIAGNOSIS — J45909 Unspecified asthma, uncomplicated: Secondary | ICD-10-CM

## 2012-01-12 MED ORDER — OMALIZUMAB 150 MG ~~LOC~~ SOLR
375.0000 mg | Freq: Once | SUBCUTANEOUS | Status: AC
  Administered 2012-01-12: 375 mg via SUBCUTANEOUS

## 2012-01-15 ENCOUNTER — Ambulatory Visit (INDEPENDENT_AMBULATORY_CARE_PROVIDER_SITE_OTHER): Payer: Self-pay

## 2012-01-15 DIAGNOSIS — J309 Allergic rhinitis, unspecified: Secondary | ICD-10-CM

## 2012-01-19 ENCOUNTER — Encounter: Payer: Self-pay | Admitting: Internal Medicine

## 2012-01-20 ENCOUNTER — Telehealth: Payer: Self-pay | Admitting: Internal Medicine

## 2012-01-20 MED ORDER — PREDNISONE 10 MG PO TABS: ORAL_TABLET | ORAL | Status: DC

## 2012-01-20 NOTE — Telephone Encounter (Signed)
I spoke with pt and he stated we needed a refill on prednisone. He is aware to keep his appt with TP 02/03/12. I have sent new RX to the pharmacy.

## 2012-01-26 ENCOUNTER — Ambulatory Visit (INDEPENDENT_AMBULATORY_CARE_PROVIDER_SITE_OTHER): Payer: Self-pay

## 2012-01-26 DIAGNOSIS — J45909 Unspecified asthma, uncomplicated: Secondary | ICD-10-CM

## 2012-01-26 MED ORDER — OMALIZUMAB 150 MG ~~LOC~~ SOLR
375.0000 mg | Freq: Once | SUBCUTANEOUS | Status: AC
  Administered 2012-01-26: 375 mg via SUBCUTANEOUS

## 2012-01-30 IMAGING — CR DG CHEST 2V
2 series · 2 of 2 positions shown · non-contrast
Comparison: 04/27/2009

CLINICAL DATA: Cough, difficulty breathing

CHEST - 2 VIEW

[w chest pa *]
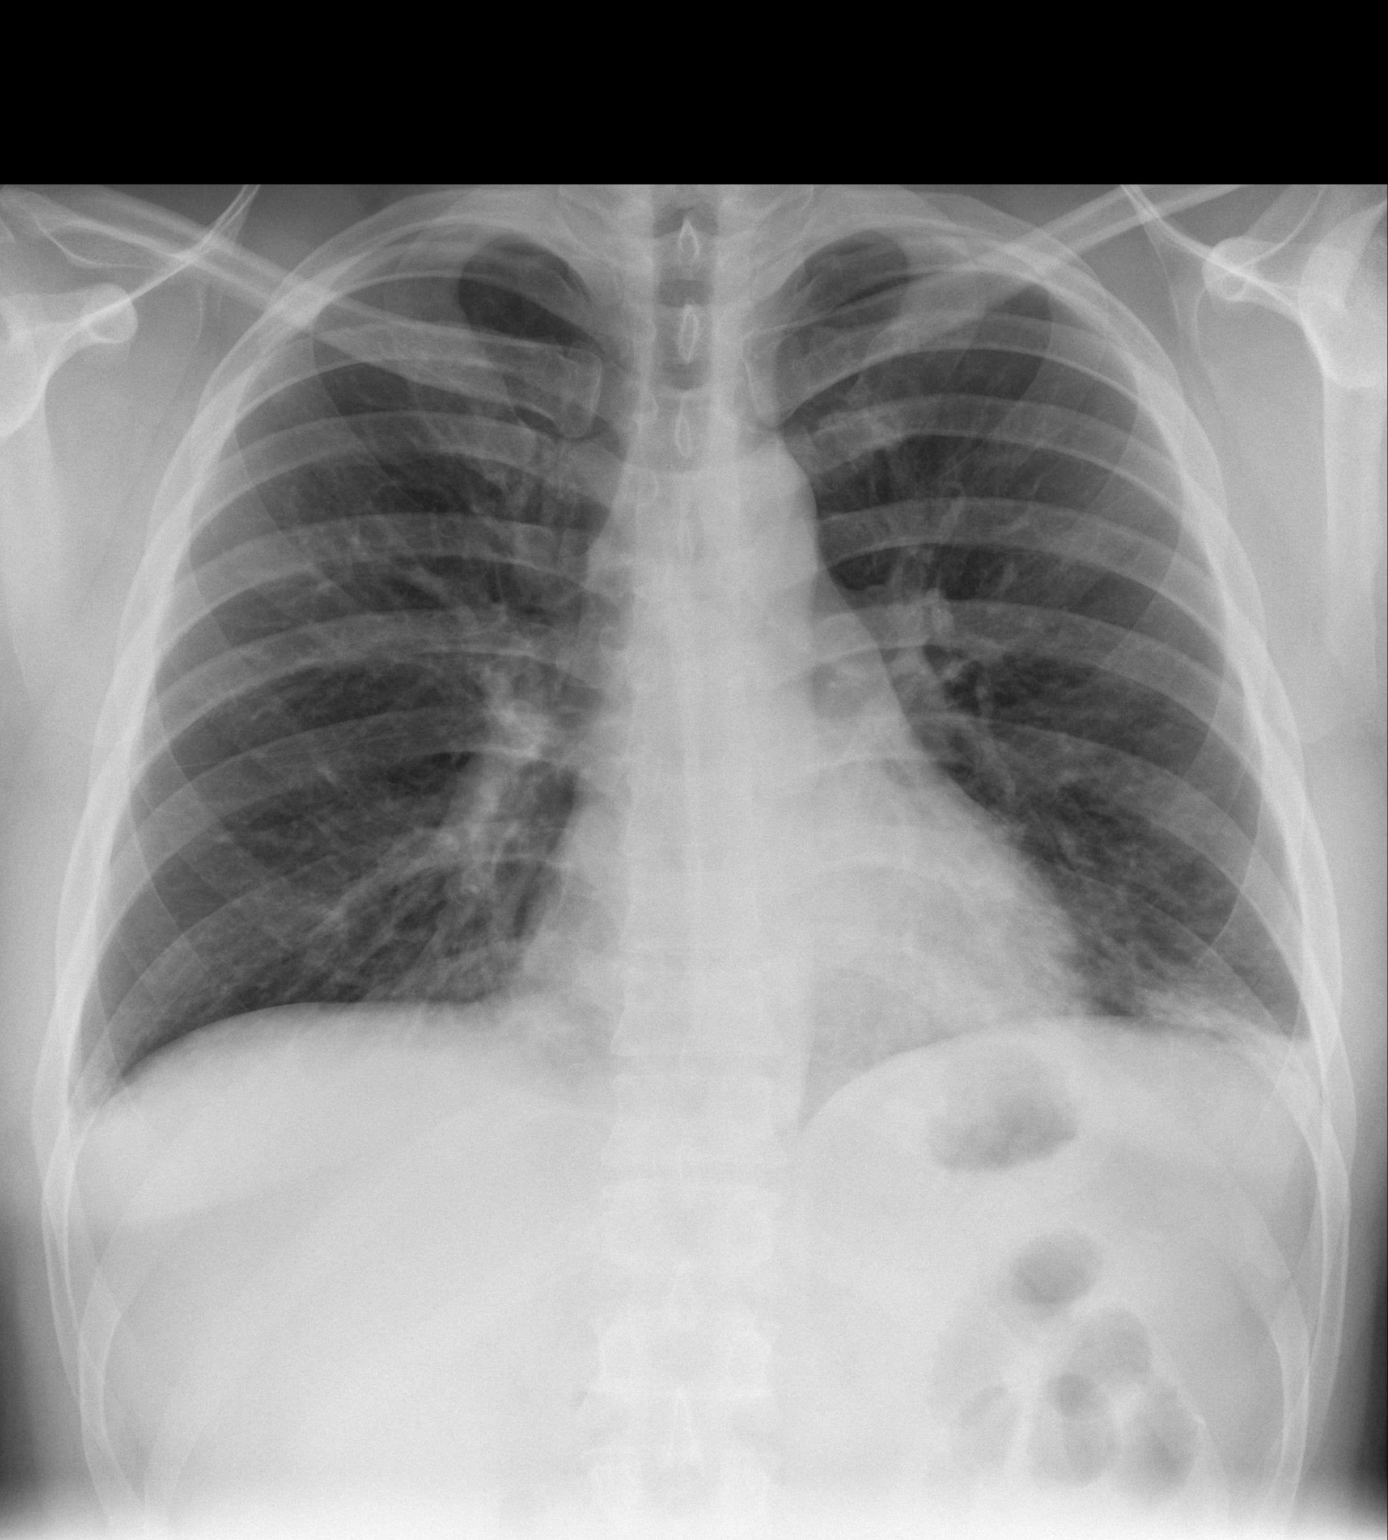

[w chest lat *]
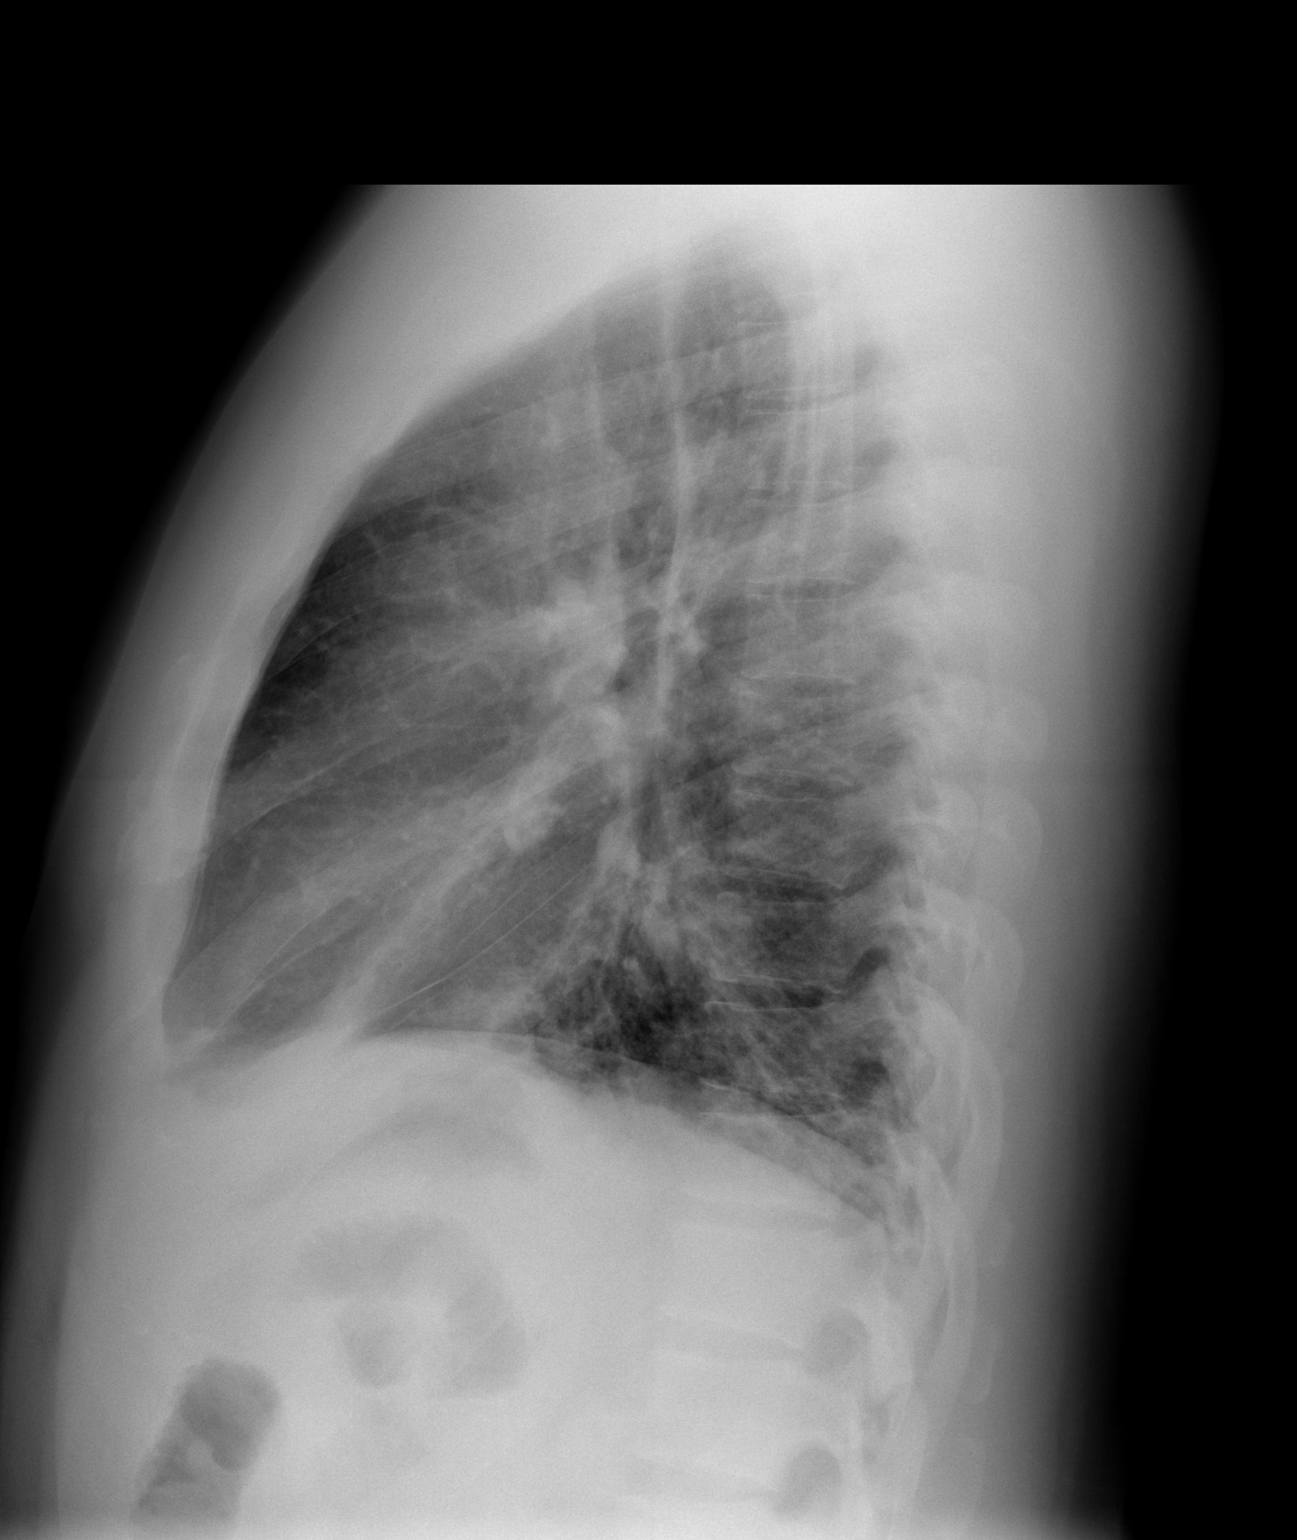

[2 of 2 positions shown; findings below may reference images not displayed]

FINDINGS: Cardiomediastinal silhouette is stable.  There is left base
airspace disease suspicious for early infiltrate/pneumonia.  No
pulmonary edema.  Bony thorax is stable.
IMPRESSION: Left basilar airspace disease suspicious for early
infiltrate/pneumonia.  No pulmonary edema.

## 2012-02-03 ENCOUNTER — Encounter: Payer: Self-pay | Admitting: Adult Health

## 2012-02-03 ENCOUNTER — Ambulatory Visit (INDEPENDENT_AMBULATORY_CARE_PROVIDER_SITE_OTHER): Payer: Self-pay | Admitting: Adult Health

## 2012-02-03 VITALS — BP 124/80 | HR 80 | Temp 97.8°F | Ht 67.0 in | Wt 227.2 lb

## 2012-02-03 DIAGNOSIS — J45909 Unspecified asthma, uncomplicated: Secondary | ICD-10-CM

## 2012-02-03 MED ORDER — PREDNISONE 5 MG PO TABS: ORAL_TABLET | ORAL | Status: DC

## 2012-02-03 NOTE — Progress Notes (Signed)
  Subjective:    Patient ID: Mike Beck, male    DOB: 10/25/1975, 36 y.o.   MRN: [id number]  HPI  PCP -Carolyne Fiscal  Allergy- Young   74 yoM former smoker followed for asthma, allergic rhinitis, eosinophilia, chronic sinusitis -initial ov 9/11  He had rhinitis in [place] before moving here in 2007.Marland Kitchen After 4 years here, he began having wheezing in July, 2010. He did not improve with a 2 month leave of absence from the job. Denies childhood respiratory disease .Wheeze, sneeze, runny nose, watery eyes and itching in throat are worse in Spring and Fall. OTC antihistamines are not very helpful.  Allergy Profile broadly elevated specific IgE w/ total IgE 1100.  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 Quit cigs 2010.  He has been on pred since July '12, 10-20 mg  On xolair q 2 weeks since 7/12  On allergy shots Since dec '11  On Advair 500 mg twice a day  He had ENT evaluation with discussion of surgery for polyps. Denies history of aspirin allergy. Spirometry- 08/25/11- mild obstructive airways disease, small airways. FVC 3.94/82%, FEV1 2.79/70%, FEV1/FVC 0.71, FEF 25-75% 1.94/46%.   09/18/2011 Running causes low back pain - bone scan neg.  12/03/11 Follow up  On 15 mg pred, last used neb 1 week ago, uses rescue mdi 2-3 times/d. 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 Has a dog in the yard that he may have to give away - even though his children will be upset. No nocturnal symptoms. Had increased symps when down to 10 mg in 4/13.  Mild clear phlegm >decreased pred 10mg    02/03/2012 Follow up  2 month follow up asthma - reports breathing is doing well but still having some wheezing, prod cough with clear to green mucus at times. No fever. .  currntly taking 20mg  prednisone daily - states 10mg  causes increased wheezing and SOB   Changed jobs  but no change in symptoms. Less dust exposure  Still has a lot of sinus congestion drainage  No hemoptysis or rash    Review of Systems  Pt denies any significant  nasal congestion or excess secretions, fever, chills, sweats, unintended wt loss, pleuritic or exertional cp, orthopnea pnd or leg swelling.  Pt also denies any obvious fluctuation in symptoms with weather or environmental change or other alleviating or aggravating factors.        Objective:   Physical Exam  Gen. Pleasant, well-nourished, in no distress ENT - no lesions, pale nasal mucosa, clear drainage  Neck: No JVD, no thyromegaly, no carotid bruits Lungs: no use of accessory muscles, no dullness to percussion, coarse BS , faint occasion wheeze on forced exp .  Cardiovascular: Rhythm regular, heart sounds  normal, no murmurs or gallops, no peripheral edema Musculoskeletal: No deformities, no cyanosis or clubbing         Assessment & Plan:

## 2012-02-03 NOTE — Patient Instructions (Addendum)
Try to decrease Prednisone slowly  On Sept 1, Begin Prednisone 20mg  alternating 15mg  daily  On OCt 1 , begin Prednisone 15mg  daily  Continue on Advair Twice daily   follow up Dr. Vassie Loll  In 2 months  Please contact office for sooner follow up if symptoms do not improve or worsen or seek emergency care

## 2012-02-09 ENCOUNTER — Ambulatory Visit (INDEPENDENT_AMBULATORY_CARE_PROVIDER_SITE_OTHER): Payer: Self-pay

## 2012-02-09 DIAGNOSIS — J45909 Unspecified asthma, uncomplicated: Secondary | ICD-10-CM

## 2012-02-09 MED ORDER — OMALIZUMAB 150 MG ~~LOC~~ SOLR
375.0000 mg | Freq: Once | SUBCUTANEOUS | Status: AC
  Administered 2012-02-09: 375 mg via SUBCUTANEOUS

## 2012-02-09 NOTE — Assessment & Plan Note (Signed)
Will try to slowly lower steroid dose to lowest possible dose without flare of symptoms  May need to consider on return trial of new inhaler. Brief trial of symbicort in past ?  Consider Dulera  Also may need second opinion from ENT regarding chronic sinus dz.   Plan;   Try to decrease Prednisone slowly  On Sept 1, Begin Prednisone 20mg  alternating 15mg  daily  On OCt 1 , begin Prednisone 15mg  daily  Continue on Advair Twice daily   follow up Dr. Vassie Loll  In 2 months  Please contact office for sooner follow up if symptoms do not improve or worsen or seek emergency care

## 2012-02-12 ENCOUNTER — Ambulatory Visit (INDEPENDENT_AMBULATORY_CARE_PROVIDER_SITE_OTHER): Payer: Self-pay

## 2012-02-12 DIAGNOSIS — J309 Allergic rhinitis, unspecified: Secondary | ICD-10-CM

## 2012-02-19 ENCOUNTER — Ambulatory Visit (INDEPENDENT_AMBULATORY_CARE_PROVIDER_SITE_OTHER): Payer: Self-pay

## 2012-02-19 DIAGNOSIS — J309 Allergic rhinitis, unspecified: Secondary | ICD-10-CM

## 2012-02-23 ENCOUNTER — Ambulatory Visit: Payer: Self-pay

## 2012-02-23 ENCOUNTER — Ambulatory Visit: Payer: Self-pay | Admitting: Internal Medicine

## 2012-02-25 ENCOUNTER — Ambulatory Visit (INDEPENDENT_AMBULATORY_CARE_PROVIDER_SITE_OTHER): Payer: Self-pay

## 2012-02-25 DIAGNOSIS — J45909 Unspecified asthma, uncomplicated: Secondary | ICD-10-CM

## 2012-02-25 MED ORDER — OMALIZUMAB 150 MG ~~LOC~~ SOLR
375.0000 mg | Freq: Once | SUBCUTANEOUS | Status: AC
  Administered 2012-02-25: 375 mg via SUBCUTANEOUS

## 2012-02-26 ENCOUNTER — Ambulatory Visit (INDEPENDENT_AMBULATORY_CARE_PROVIDER_SITE_OTHER): Payer: Self-pay

## 2012-02-26 DIAGNOSIS — J309 Allergic rhinitis, unspecified: Secondary | ICD-10-CM

## 2012-03-04 ENCOUNTER — Ambulatory Visit (INDEPENDENT_AMBULATORY_CARE_PROVIDER_SITE_OTHER): Payer: Self-pay

## 2012-03-04 DIAGNOSIS — J309 Allergic rhinitis, unspecified: Secondary | ICD-10-CM

## 2012-03-07 ENCOUNTER — Telehealth: Payer: Self-pay | Admitting: Pulmonary Disease

## 2012-03-07 MED ORDER — PREDNISONE 5 MG PO TABS: ORAL_TABLET | ORAL | Status: DC

## 2012-03-07 NOTE — Telephone Encounter (Signed)
I spoke with pt and he stated he needed a refill on pred 5 mg. I have sent in RX nothing further was needed

## 2012-03-11 ENCOUNTER — Ambulatory Visit (INDEPENDENT_AMBULATORY_CARE_PROVIDER_SITE_OTHER): Payer: Self-pay

## 2012-03-11 DIAGNOSIS — J309 Allergic rhinitis, unspecified: Secondary | ICD-10-CM

## 2012-03-15 ENCOUNTER — Ambulatory Visit (INDEPENDENT_AMBULATORY_CARE_PROVIDER_SITE_OTHER): Payer: Self-pay

## 2012-03-15 DIAGNOSIS — J45909 Unspecified asthma, uncomplicated: Secondary | ICD-10-CM

## 2012-03-16 MED ORDER — OMALIZUMAB 150 MG ~~LOC~~ SOLR
375.0000 mg | Freq: Once | SUBCUTANEOUS | Status: AC
  Administered 2012-03-16: 375 mg via SUBCUTANEOUS

## 2012-03-18 ENCOUNTER — Ambulatory Visit (INDEPENDENT_AMBULATORY_CARE_PROVIDER_SITE_OTHER): Payer: Self-pay

## 2012-03-18 DIAGNOSIS — J309 Allergic rhinitis, unspecified: Secondary | ICD-10-CM

## 2012-03-22 ENCOUNTER — Encounter: Payer: Self-pay | Admitting: Internal Medicine

## 2012-03-25 ENCOUNTER — Ambulatory Visit (INDEPENDENT_AMBULATORY_CARE_PROVIDER_SITE_OTHER): Payer: Self-pay

## 2012-03-25 DIAGNOSIS — J309 Allergic rhinitis, unspecified: Secondary | ICD-10-CM

## 2012-03-28 ENCOUNTER — Encounter: Payer: Self-pay | Admitting: Pulmonary Disease

## 2012-03-28 ENCOUNTER — Ambulatory Visit (INDEPENDENT_AMBULATORY_CARE_PROVIDER_SITE_OTHER): Payer: Self-pay | Admitting: Pulmonary Disease

## 2012-03-28 VITALS — BP 128/80 | HR 83 | Temp 98.8°F | Ht 68.0 in | Wt 217.4 lb

## 2012-03-28 DIAGNOSIS — Z23 Encounter for immunization: Secondary | ICD-10-CM

## 2012-03-28 DIAGNOSIS — J45909 Unspecified asthma, uncomplicated: Secondary | ICD-10-CM

## 2012-03-28 MED ORDER — PREDNISONE 5 MG PO TABS: ORAL_TABLET | ORAL | Status: DC

## 2012-03-28 NOTE — Progress Notes (Signed)
  Subjective:    Patient ID: Mike Beck, male    DOB: [birth year], 36 y.o.   MRN: [id number]  HPI PCP -Carolyne Fiscal  Allergy- Young   58 yoM former smoker followed for asthma, allergic rhinitis, eosinophilia, chronic sinusitis -initial ov 9/11  He had rhinitis in [place] before moving here in 2007. After 4 years here, he began having wheezing in July, 2010. He did not improve with a 2 month leave of absence from the job. Denies childhood respiratory disease .Wheeze, sneeze, runny nose, watery eyes and itching in throat are worse in Spring and Fall. OTC antihistamines are not very helpful.  Allergy Profile broadly elevated specific IgE w/ total IgE 1100.  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 Lives in house, outside dog, no basement, no mold. Quit cigs 2010.  He has been on pred since July '12, 10-20 mg  On xolair q 2 weeks since 7/12  On allergy shots Since dec '11  On Advair 500 mg twice a day  He had ENT evaluation with discussion of surgery for polyps. NO history of aspirin allergy.  Spirometry- 08/25/11- mild obstructive airways disease, small airways. FVC 3.94/82%, FEV1 2.79/70%, FEV1/FVC 0.71, FEF 25-75% 1.94/46%.  09/18/2011 Running causes low back pain - bone scan neg.     03/28/2012  c/o nasal congestion, chest tx, little cough w/ green phlem. no sore throat  2 month follow up asthma - reports breathing is doing well but still having some wheezing, prod cough with clear to green mucus at times. No fever. currntly taking 20mg  prednisone daily - states 10mg  causes increased wheezing and SOB , 40 mg provides dramatic relief Changed jobs but no change in symptoms. Less dust exposure  Gave dog away, now thinking of changing house Still has a lot of sinus congestion drainage  No hemoptysis or rash    Review of Systems neg for any significant sore throat, dysphagia, itching, sneezing, nasal congestion or excess/  purulent secretions, fever, chills, sweats, unintended wt loss, pleuritic or exertional cp, hempoptysis, orthopnea pnd or change in chronic leg swelling. Also denies presyncope, palpitations, heartburn, abdominal pain, nausea, vomiting, diarrhea or change in bowel or urinary habits, dysuria,hematuria, rash, arthralgias, visual complaints, headache, numbness weakness or ataxia.     Objective:   Physical Exam  Gen. Pleasant, well-nourished, in no distress ENT - no lesions, no post nasal drip Neck: No JVD, no thyromegaly, no carotid bruits Lungs: no use of accessory muscles, no dullness to percussion, clear without rales, faint exp rhonchi  Cardiovascular: Rhythm regular, heart sounds  normal, no murmurs or gallops, no peripheral edema Musculoskeletal: No deformities, no cyanosis or clubbing        Assessment & Plan:

## 2012-03-28 NOTE — Assessment & Plan Note (Addendum)
Severe persistent, uncontrolled with frequent flares Xolair started 2012 Allergy vaccine started 05/21/2010 Prednisone dependent about 15-20 mg  -10 mg makes symptoms worse, 40 mg provides dramatic relief   Refer to [facility] for second opinion - although ABPA is certianly possible, have not obtained CT scan due to cost issues. IgE was > 1000 & aspergillus Abs on RAST mildly positive.  Absence of infiltrates or skin lesions makes other diagnoses like churg strauss less likely. He does not have obvious aspirin sensitivity. He has changed his job, given the dog away & is thinking of changing houses

## 2012-03-28 NOTE — Patient Instructions (Signed)
We will try to send you for a second opinion Stay on advair 500/50 twice daily Stay on prednisone 20 alternating with 15 mg

## 2012-03-29 ENCOUNTER — Ambulatory Visit (INDEPENDENT_AMBULATORY_CARE_PROVIDER_SITE_OTHER): Payer: Self-pay

## 2012-03-29 DIAGNOSIS — J45909 Unspecified asthma, uncomplicated: Secondary | ICD-10-CM

## 2012-03-30 MED ORDER — OMALIZUMAB 150 MG ~~LOC~~ SOLR
375.0000 mg | Freq: Once | SUBCUTANEOUS | Status: AC
  Administered 2012-03-30: 375 mg via SUBCUTANEOUS

## 2012-04-05 ENCOUNTER — Ambulatory Visit: Payer: Self-pay | Admitting: Internal Medicine

## 2012-04-12 ENCOUNTER — Telehealth: Payer: Self-pay | Admitting: Internal Medicine

## 2012-04-12 ENCOUNTER — Ambulatory Visit (INDEPENDENT_AMBULATORY_CARE_PROVIDER_SITE_OTHER): Payer: Self-pay

## 2012-04-12 DIAGNOSIS — J45909 Unspecified asthma, uncomplicated: Secondary | ICD-10-CM

## 2012-04-12 NOTE — Telephone Encounter (Signed)
Order has been placed. Please advise PCC's thanks 

## 2012-04-12 NOTE — Telephone Encounter (Signed)
Will send inf to [facility] for appt Tobe Sos

## 2012-04-12 NOTE — Telephone Encounter (Signed)
OK- can we try [facility] please ?

## 2012-04-12 NOTE — Telephone Encounter (Signed)
I spoke with Dois Davenport from [facility] and she stated pt's referral to them was declined. Per Dois Davenport what happens is once they get the referral it is entered in there system then the doctors decide if they want to accept/decline referral. Per sandra no reason was giving why it was declined. She stated usually if it is declined it's because pt can be seen by someone else closer to home.  Please advise Dr. Vassie Loll thanks

## 2012-04-13 MED ORDER — OMALIZUMAB 150 MG ~~LOC~~ SOLR
375.0000 mg | Freq: Once | SUBCUTANEOUS | Status: AC
  Administered 2012-04-13: 375 mg via SUBCUTANEOUS

## 2012-04-15 ENCOUNTER — Ambulatory Visit (INDEPENDENT_AMBULATORY_CARE_PROVIDER_SITE_OTHER): Payer: Self-pay

## 2012-04-15 ENCOUNTER — Telehealth: Payer: Self-pay | Admitting: Internal Medicine

## 2012-04-15 ENCOUNTER — Telehealth: Payer: Self-pay | Admitting: Pulmonary Disease

## 2012-04-15 DIAGNOSIS — J309 Allergic rhinitis, unspecified: Secondary | ICD-10-CM

## 2012-04-15 NOTE — Telephone Encounter (Signed)
Called and spoke to wendy let her know pt is still on xolair just waiting for alva to sign rx to fax back

## 2012-04-15 NOTE — Telephone Encounter (Signed)
Dr. Vassie Loll will you be coming by the office any next week? RX needs to have your signature (not stamped signature) on it. If so I will print off and put on your desk or can fax it to you over in [place]. Please advise thanks

## 2012-04-15 NOTE — Telephone Encounter (Signed)
xolair 375 mcg SQ q 2 wks

## 2012-04-16 NOTE — Telephone Encounter (Signed)
Pl fax to [facility] on monday

## 2012-04-19 MED ORDER — OMALIZUMAB 150 MG ~~LOC~~ SOLR: 375.0000 mg | SUBCUTANEOUS | Status: DC

## 2012-04-19 NOTE — Telephone Encounter (Signed)
Will print off rx to fax to Dr Vassie Loll. ================= Rx printed and will be faxed to [facility] per Johny Drilling for RA to sign.  Will route back to RA to make him aware.

## 2012-04-19 NOTE — Telephone Encounter (Signed)
Per Johny Drilling, we can call [phone] to see if this was received.  Called this #, spoke with Terrace Arabia.  Was advised xolair rx was received today.  Will route to Dr. Vassie Loll as Lorain Childes.  Johny Drilling, do you still need this phone msg for anything?

## 2012-04-19 NOTE — Telephone Encounter (Signed)
I have already faxed tot he no listed on the form - pl confirm they have received it

## 2012-04-22 ENCOUNTER — Ambulatory Visit (INDEPENDENT_AMBULATORY_CARE_PROVIDER_SITE_OTHER): Payer: Self-pay

## 2012-04-22 DIAGNOSIS — J309 Allergic rhinitis, unspecified: Secondary | ICD-10-CM

## 2012-04-25 ENCOUNTER — Telehealth: Payer: Self-pay | Admitting: Pulmonary Disease

## 2012-04-25 MED ORDER — PREDNISONE 5 MG PO TABS: ORAL_TABLET | ORAL | Status: DC

## 2012-04-25 NOTE — Telephone Encounter (Signed)
Rx was sent to [facility] with pt and notified that this was done Pt verbalized understanding and states nothing further needed

## 2012-04-26 ENCOUNTER — Ambulatory Visit (INDEPENDENT_AMBULATORY_CARE_PROVIDER_SITE_OTHER): Payer: Self-pay

## 2012-04-26 DIAGNOSIS — J45909 Unspecified asthma, uncomplicated: Secondary | ICD-10-CM

## 2012-04-26 MED ORDER — OMALIZUMAB 150 MG ~~LOC~~ SOLR
375.0000 mg | Freq: Once | SUBCUTANEOUS | Status: AC
  Administered 2012-04-26: 375 mg via SUBCUTANEOUS

## 2012-05-06 ENCOUNTER — Ambulatory Visit (INDEPENDENT_AMBULATORY_CARE_PROVIDER_SITE_OTHER): Payer: Self-pay

## 2012-05-06 DIAGNOSIS — J309 Allergic rhinitis, unspecified: Secondary | ICD-10-CM

## 2012-05-10 ENCOUNTER — Ambulatory Visit (INDEPENDENT_AMBULATORY_CARE_PROVIDER_SITE_OTHER): Payer: Self-pay

## 2012-05-10 DIAGNOSIS — J45909 Unspecified asthma, uncomplicated: Secondary | ICD-10-CM

## 2012-05-10 MED ORDER — OMALIZUMAB 150 MG ~~LOC~~ SOLR
375.0000 mg | Freq: Once | SUBCUTANEOUS | Status: AC
  Administered 2012-05-10: 375 mg via SUBCUTANEOUS

## 2012-05-16 ENCOUNTER — Telehealth: Payer: Self-pay | Admitting: Pulmonary Disease

## 2012-05-16 MED ORDER — PREDNISONE 5 MG PO TABS: ORAL_TABLET | ORAL | Status: DC

## 2012-05-16 NOTE — Telephone Encounter (Signed)
Verified medication w patient. Rx sent in and nothing further needed at this time.

## 2012-05-20 ENCOUNTER — Ambulatory Visit (INDEPENDENT_AMBULATORY_CARE_PROVIDER_SITE_OTHER): Payer: Self-pay

## 2012-05-20 DIAGNOSIS — J309 Allergic rhinitis, unspecified: Secondary | ICD-10-CM

## 2012-05-23 ENCOUNTER — Ambulatory Visit (INDEPENDENT_AMBULATORY_CARE_PROVIDER_SITE_OTHER): Payer: Self-pay

## 2012-05-23 DIAGNOSIS — J309 Allergic rhinitis, unspecified: Secondary | ICD-10-CM

## 2012-05-23 IMAGING — CR DG CHEST 2V
2 series · 2 of 2 positions shown · non-contrast
Comparison: Two-view chest x-ray 01/11/2010, 12/21/2009,
04/27/2009, and 11/10/2008.

CLINICAL DATA: Cough.  Wheezing.  History of asthma.  Former
smoker.

CHEST - 2 VIEW 04/14/2010:

[view not recorded (1 of 2)]
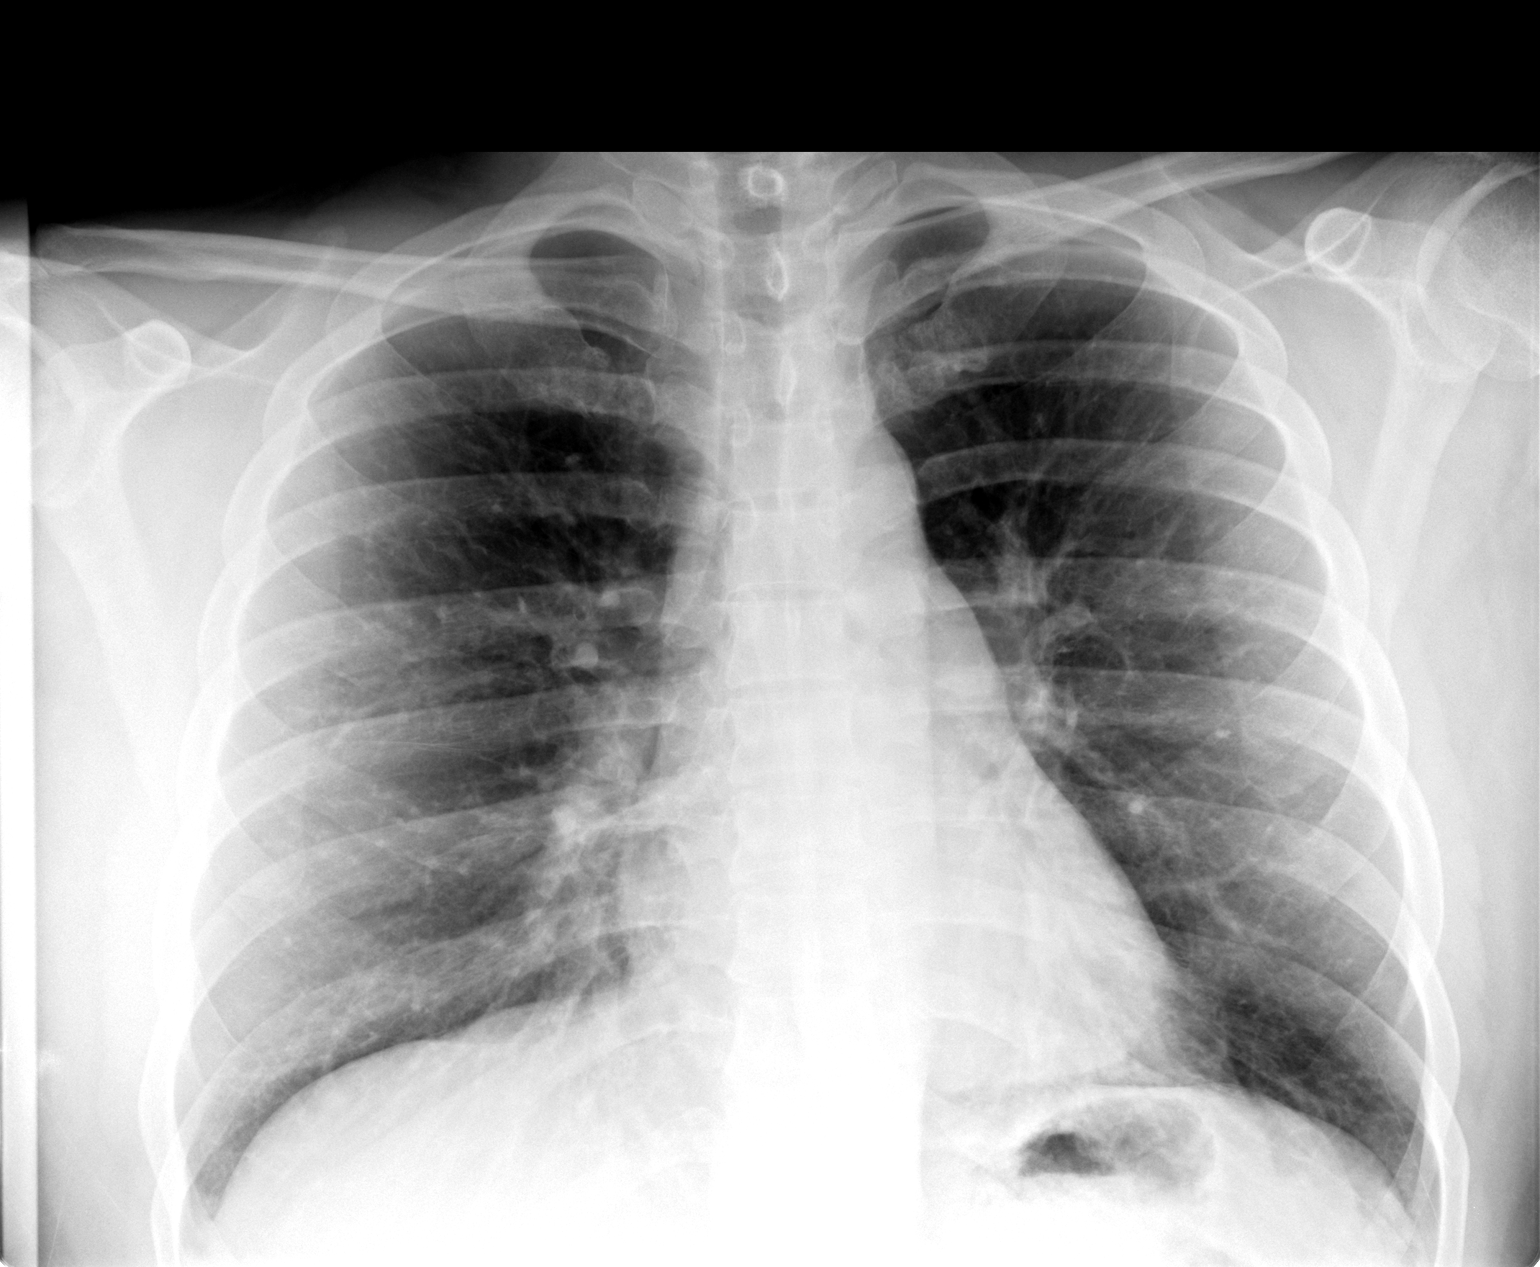

[view not recorded (2 of 2)]
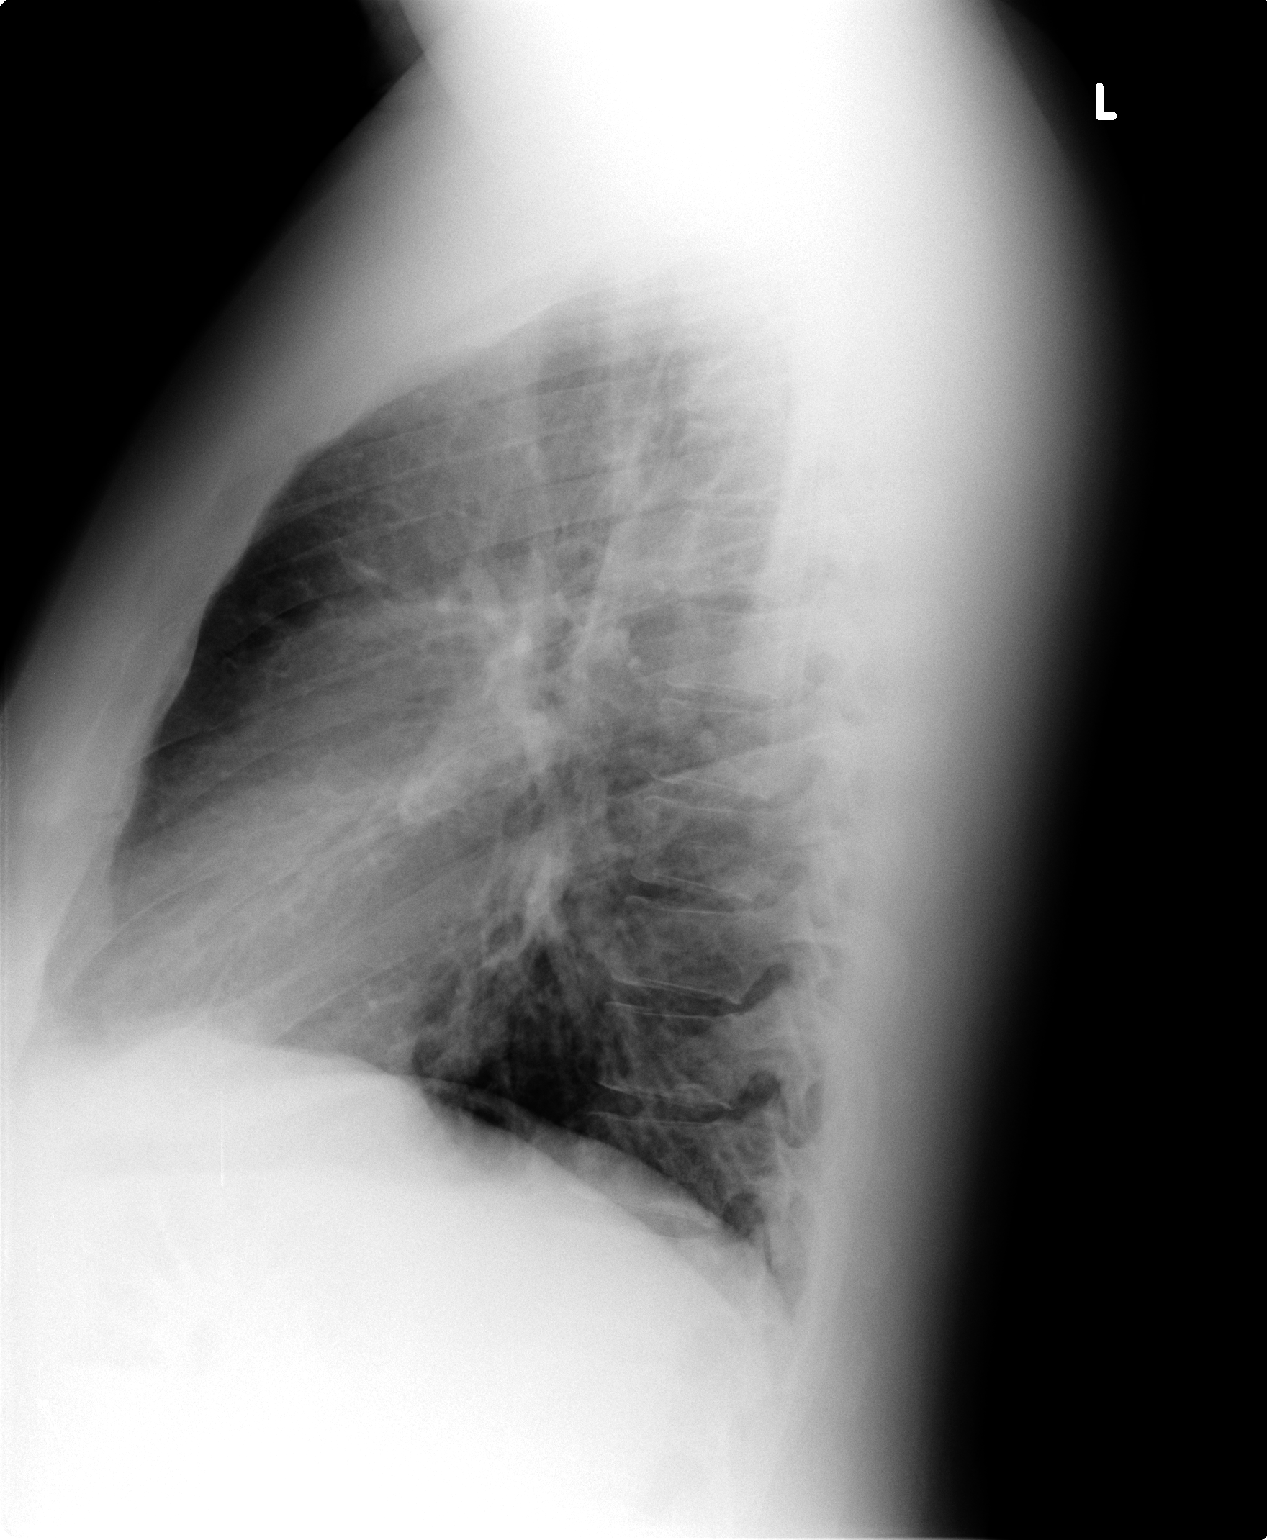

[2 of 2 positions shown; findings below may reference images not displayed]

FINDINGS: Cardiomediastinal silhouette unremarkable and unchanged.
Mildly prominent bronchovascular markings diffusely and mild
central peribronchial thickening, more so than on the prior
examinations.  Interval resolution of the focal airspace opacity in
the left lower lobe.  Lungs otherwise clear.  No pleural effusions.
Visualized bony thorax intact.
IMPRESSION: Mild changes of acute bronchitis and/or asthma without localized
airspace pneumonia.  Interval resolution of the left lower lobe
pneumonia since the most recent prior examination.

## 2012-05-24 ENCOUNTER — Ambulatory Visit: Payer: Self-pay | Admitting: Internal Medicine

## 2012-05-24 ENCOUNTER — Ambulatory Visit (INDEPENDENT_AMBULATORY_CARE_PROVIDER_SITE_OTHER): Payer: Self-pay

## 2012-05-24 DIAGNOSIS — J45909 Unspecified asthma, uncomplicated: Secondary | ICD-10-CM

## 2012-05-26 ENCOUNTER — Ambulatory Visit: Payer: Self-pay | Admitting: Adult Health

## 2012-05-27 ENCOUNTER — Telehealth: Payer: Self-pay | Admitting: Pulmonary Disease

## 2012-05-27 ENCOUNTER — Ambulatory Visit (INDEPENDENT_AMBULATORY_CARE_PROVIDER_SITE_OTHER): Payer: Self-pay

## 2012-05-27 DIAGNOSIS — J309 Allergic rhinitis, unspecified: Secondary | ICD-10-CM

## 2012-05-27 DIAGNOSIS — G4733 Obstructive sleep apnea (adult) (pediatric): Secondary | ICD-10-CM

## 2012-05-27 MED ORDER — OMALIZUMAB 150 MG ~~LOC~~ SOLR
375.0000 mg | Freq: Once | SUBCUTANEOUS | Status: AC
  Administered 2012-05-27: 375 mg via SUBCUTANEOUS

## 2012-05-27 NOTE — Telephone Encounter (Signed)
i spoke with pt and he agree'd to a home sleep study. i advised pt our pcc's will contact him to get this set up for him. Nothing further was needed

## 2012-05-27 NOTE — Telephone Encounter (Signed)
I am glad he was able to see Dr Marisa Sprinkles at [facility]. She has recommended a sleep study. We can perform a home study if he is agreeable. OK to order

## 2012-06-03 ENCOUNTER — Ambulatory Visit (INDEPENDENT_AMBULATORY_CARE_PROVIDER_SITE_OTHER): Payer: Self-pay

## 2012-06-03 DIAGNOSIS — J309 Allergic rhinitis, unspecified: Secondary | ICD-10-CM

## 2012-06-06 ENCOUNTER — Telehealth: Payer: Self-pay | Admitting: Pulmonary Disease

## 2012-06-06 NOTE — Telephone Encounter (Signed)
Per [facility] okay to dbl and bring pt in on 06/07/12. Pt is scheduled to come in at 11:15. Nothing further was needed

## 2012-06-06 NOTE — Telephone Encounter (Signed)
Home study showed mild sleep apnea - stopped breathing 12/h with mild oxygen drop. Would qualify for CPAP if he is willing to trial  autoCPAP 5-12 cm, mask of choice, humidity , download in 4 wks.  If he wants to discuss more, ok to schedule OV

## 2012-06-06 NOTE — Telephone Encounter (Signed)
k- will discuss on OV

## 2012-06-06 NOTE — Telephone Encounter (Signed)
Pt aware of results. He would like to discuss this with RA in more detail. Is it okay to double book on Thursday per pt request. Also he does not have insurance. Please advise Dr. Vassie Loll thanks

## 2012-06-07 ENCOUNTER — Ambulatory Visit (INDEPENDENT_AMBULATORY_CARE_PROVIDER_SITE_OTHER): Payer: Self-pay

## 2012-06-07 ENCOUNTER — Ambulatory Visit (INDEPENDENT_AMBULATORY_CARE_PROVIDER_SITE_OTHER): Payer: Self-pay | Admitting: Pulmonary Disease

## 2012-06-07 ENCOUNTER — Encounter: Payer: Self-pay | Admitting: Pulmonary Disease

## 2012-06-07 VITALS — BP 122/70 | HR 68 | Temp 98.2°F | Ht 67.0 in | Wt 222.0 lb

## 2012-06-07 DIAGNOSIS — J45909 Unspecified asthma, uncomplicated: Secondary | ICD-10-CM

## 2012-06-07 DIAGNOSIS — G4733 Obstructive sleep apnea (adult) (pediatric): Secondary | ICD-10-CM

## 2012-06-07 MED ORDER — OMALIZUMAB 150 MG ~~LOC~~ SOLR
375.0000 mg | Freq: Once | SUBCUTANEOUS | Status: AC
  Administered 2012-06-07: 375 mg via SUBCUTANEOUS

## 2012-06-07 NOTE — Patient Instructions (Signed)
Stay on qvar 80- I am happy this is helping Stay on 5mg  prednisone daily , On jan 20th drop to 5mg  MWF Sleep study showed mild obstructive sleep apnea - we will hold off cpap for now

## 2012-06-07 NOTE — Progress Notes (Signed)
  Subjective:    Patient ID: Mike Beck, male    DOB: [birth year], 36 y.o.   MRN: [id number]  HPI PCP -Carolyne Fiscal  Allergy- Young   36 yo former smoker followed for asthma, allergic rhinitis, eosinophilia, chronic sinusitis -initial ov 9/11   He had rhinitis in [place] before moving here in 2007. After 4 years here, he began having wheezing in July, 2010. He did not improve with a 2 month leave of absence from the job. Denies childhood respiratory disease .Wheeze, sneeze, runny nose, watery eyes and itching in throat are worse in Spring and Fall.   Allergy Profile broadly elevated specific IgE w/ total IgE 1100.  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 Lives in house, outside dog, no basement, no mold. Quit cigs 2010.  He has been on pred since July '12, 10-20 mg  On xolair q 2 weeks since 7/12  On allergy shots Since dec '11  On Advair 500 mg twice a day  He had ENT evaluation with discussion of surgery for polyps. NO history of aspirin allergy.  Spirometry- 08/25/11- mild obstructive airways disease, small airways. FVC 3.94/82%, FEV1 2.79/70%, FEV1/FVC 0.71, FEF 25-75% 1.94/46%.  09/18/2011  low back pain - bone scan neg.    06/07/2012 Home study showed mild sleep apnea - AHI 12/h with mild oxygen drop. Saw dr Marisa Sprinkles at [facility]- added Qvar - dramatic relief  Down to 5mg  pred !   Review of Systems neg for any significant sore throat, dysphagia, itching, sneezing, nasal congestion or excess/ purulent secretions, fever, chills, sweats, unintended wt loss, pleuritic or exertional cp, hempoptysis, orthopnea pnd or change in chronic leg swelling. Also denies presyncope, palpitations, heartburn, abdominal pain, nausea, vomiting, diarrhea or change in bowel or urinary habits, dysuria,hematuria, rash, arthralgias, visual complaints, headache, numbness weakness or ataxia.     Objective:   Physical Exam  Gen. Pleasant,  well-nourished, in no distress ENT - no lesions, no post nasal drip Neck: No JVD, no thyromegaly, no carotid bruits Lungs: no use of accessory muscles, no dullness to percussion, clear without rales or rhonchi  Cardiovascular: Rhythm regular, heart sounds  normal, no murmurs or gallops, no peripheral edema Musculoskeletal: No deformities, no cyanosis or clubbing        Assessment & Plan:

## 2012-06-07 NOTE — Assessment & Plan Note (Signed)
The pathophysiology of obstructive sleep apnea , it's cardiovascular consequences & modes of treatment including CPAP were discused with the patient in detail & they evidenced understanding. Hold off CPAP for now- discussed benefits in asthma

## 2012-06-07 NOTE — Assessment & Plan Note (Addendum)
Severe persistent, uncontrolled with frequent flares Xolair started 2012 Allergy vaccine started 05/21/2010 Prednisone dependent about 15-20 mg  - down to 5mg  dec'13 after adding qvar  Stay on qvar 80 bid Stay on 5mg  prednisone daily , On jan 20th drop to 5mg  MWF

## 2012-06-24 ENCOUNTER — Ambulatory Visit (INDEPENDENT_AMBULATORY_CARE_PROVIDER_SITE_OTHER): Payer: Self-pay

## 2012-06-24 DIAGNOSIS — J309 Allergic rhinitis, unspecified: Secondary | ICD-10-CM

## 2012-06-28 ENCOUNTER — Ambulatory Visit (INDEPENDENT_AMBULATORY_CARE_PROVIDER_SITE_OTHER): Payer: Self-pay

## 2012-06-28 DIAGNOSIS — J45909 Unspecified asthma, uncomplicated: Secondary | ICD-10-CM

## 2012-06-29 MED ORDER — OMALIZUMAB 150 MG ~~LOC~~ SOLR
375.0000 mg | Freq: Once | SUBCUTANEOUS | Status: AC
  Administered 2012-06-29: 375 mg via SUBCUTANEOUS

## 2012-07-01 ENCOUNTER — Ambulatory Visit: Payer: Self-pay

## 2012-07-15 ENCOUNTER — Ambulatory Visit (INDEPENDENT_AMBULATORY_CARE_PROVIDER_SITE_OTHER): Payer: Self-pay

## 2012-07-15 DIAGNOSIS — J309 Allergic rhinitis, unspecified: Secondary | ICD-10-CM

## 2012-08-23 ENCOUNTER — Ambulatory Visit (INDEPENDENT_AMBULATORY_CARE_PROVIDER_SITE_OTHER): Payer: Self-pay

## 2012-08-23 DIAGNOSIS — J45909 Unspecified asthma, uncomplicated: Secondary | ICD-10-CM

## 2012-08-24 MED ORDER — OMALIZUMAB 150 MG ~~LOC~~ SOLR
375.0000 mg | Freq: Once | SUBCUTANEOUS | Status: AC
  Administered 2012-08-24: 375 mg via SUBCUTANEOUS

## 2012-09-05 ENCOUNTER — Ambulatory Visit: Payer: Self-pay | Admitting: Adult Health

## 2012-09-09 ENCOUNTER — Ambulatory Visit (INDEPENDENT_AMBULATORY_CARE_PROVIDER_SITE_OTHER): Payer: Self-pay

## 2012-09-09 DIAGNOSIS — J309 Allergic rhinitis, unspecified: Secondary | ICD-10-CM

## 2012-09-14 ENCOUNTER — Ambulatory Visit: Payer: Self-pay

## 2012-09-16 ENCOUNTER — Ambulatory Visit (INDEPENDENT_AMBULATORY_CARE_PROVIDER_SITE_OTHER): Payer: Self-pay

## 2012-09-16 DIAGNOSIS — J309 Allergic rhinitis, unspecified: Secondary | ICD-10-CM

## 2012-09-20 ENCOUNTER — Ambulatory Visit (INDEPENDENT_AMBULATORY_CARE_PROVIDER_SITE_OTHER): Payer: Self-pay

## 2012-09-20 DIAGNOSIS — J45909 Unspecified asthma, uncomplicated: Secondary | ICD-10-CM

## 2012-09-21 MED ORDER — OMALIZUMAB 150 MG ~~LOC~~ SOLR
375.0000 mg | Freq: Once | SUBCUTANEOUS | Status: AC
  Administered 2012-09-21: 375 mg via SUBCUTANEOUS

## 2012-09-22 ENCOUNTER — Ambulatory Visit: Payer: Self-pay

## 2012-09-30 ENCOUNTER — Ambulatory Visit (INDEPENDENT_AMBULATORY_CARE_PROVIDER_SITE_OTHER): Payer: Self-pay

## 2012-09-30 DIAGNOSIS — J309 Allergic rhinitis, unspecified: Secondary | ICD-10-CM

## 2012-10-07 ENCOUNTER — Ambulatory Visit: Payer: Self-pay

## 2012-10-14 ENCOUNTER — Ambulatory Visit (INDEPENDENT_AMBULATORY_CARE_PROVIDER_SITE_OTHER): Payer: Self-pay

## 2012-10-14 DIAGNOSIS — J309 Allergic rhinitis, unspecified: Secondary | ICD-10-CM

## 2012-10-21 ENCOUNTER — Ambulatory Visit: Payer: Self-pay

## 2012-11-29 ENCOUNTER — Ambulatory Visit (INDEPENDENT_AMBULATORY_CARE_PROVIDER_SITE_OTHER): Payer: Self-pay

## 2012-11-29 DIAGNOSIS — J45909 Unspecified asthma, uncomplicated: Secondary | ICD-10-CM

## 2012-11-29 DIAGNOSIS — J309 Allergic rhinitis, unspecified: Secondary | ICD-10-CM

## 2012-12-02 MED ORDER — OMALIZUMAB 150 MG ~~LOC~~ SOLR
375.0000 mg | Freq: Once | SUBCUTANEOUS | Status: AC
  Administered 2012-12-02: 375 mg via SUBCUTANEOUS

## 2012-12-13 ENCOUNTER — Ambulatory Visit (INDEPENDENT_AMBULATORY_CARE_PROVIDER_SITE_OTHER): Payer: Self-pay

## 2012-12-13 DIAGNOSIS — J309 Allergic rhinitis, unspecified: Secondary | ICD-10-CM

## 2012-12-13 DIAGNOSIS — J45909 Unspecified asthma, uncomplicated: Secondary | ICD-10-CM

## 2012-12-14 MED ORDER — OMALIZUMAB 150 MG ~~LOC~~ SOLR
375.0000 mg | Freq: Once | SUBCUTANEOUS | Status: AC
  Administered 2012-12-14: 375 mg via SUBCUTANEOUS

## 2012-12-27 ENCOUNTER — Ambulatory Visit: Payer: Self-pay

## 2013-01-10 ENCOUNTER — Ambulatory Visit (INDEPENDENT_AMBULATORY_CARE_PROVIDER_SITE_OTHER): Payer: Self-pay

## 2013-01-10 DIAGNOSIS — J309 Allergic rhinitis, unspecified: Secondary | ICD-10-CM

## 2013-01-10 DIAGNOSIS — J45909 Unspecified asthma, uncomplicated: Secondary | ICD-10-CM

## 2013-01-12 MED ORDER — OMALIZUMAB 150 MG ~~LOC~~ SOLR
375.0000 mg | Freq: Once | SUBCUTANEOUS | Status: AC
  Administered 2013-01-12: 375 mg via SUBCUTANEOUS

## 2013-01-17 ENCOUNTER — Telehealth: Payer: Self-pay | Admitting: Pulmonary Disease

## 2013-01-17 MED ORDER — FLUTICASONE-SALMETEROL 500-50 MCG/DOSE IN AEPB: 1.0000 | INHALATION_SPRAY | Freq: Two times a day (BID) | RESPIRATORY_TRACT | Status: DC

## 2013-01-17 NOTE — Telephone Encounter (Signed)
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 Vassie Loll not into office until Thursday Rx has been printed off, placed in Alva's look out will forward to Mindy/Dr. Vassie Loll so they are aware  **Patient requesting return call once rx has been faxed off, thank you

## 2013-01-19 NOTE — Telephone Encounter (Signed)
RX has been faxed back pt aware

## 2013-01-24 ENCOUNTER — Ambulatory Visit: Payer: Self-pay

## 2013-01-24 DIAGNOSIS — J45909 Unspecified asthma, uncomplicated: Secondary | ICD-10-CM

## 2013-01-25 ENCOUNTER — Ambulatory Visit (INDEPENDENT_AMBULATORY_CARE_PROVIDER_SITE_OTHER): Payer: Self-pay

## 2013-01-25 DIAGNOSIS — J309 Allergic rhinitis, unspecified: Secondary | ICD-10-CM

## 2013-01-25 DIAGNOSIS — J45909 Unspecified asthma, uncomplicated: Secondary | ICD-10-CM

## 2013-01-26 ENCOUNTER — Telehealth: Payer: Self-pay | Admitting: Pulmonary Disease

## 2013-01-26 MED ORDER — FLUTICASONE-SALMETEROL 500-50 MCG/DOSE IN AEPB: 1.0000 | INHALATION_SPRAY | Freq: Two times a day (BID) | RESPIRATORY_TRACT | Status: DC

## 2013-01-26 NOTE — Telephone Encounter (Signed)
RX was sent to [facility] not [facility]. Pt provided a different fax # than prior message. I called [facility] got correct fax # [phone]. RA will sign RX and fax back. Nothing further needed

## 2013-01-30 MED ORDER — OMALIZUMAB 150 MG ~~LOC~~ SOLR
375.0000 mg | Freq: Once | SUBCUTANEOUS | Status: AC
  Administered 2013-01-30: 375 mg via SUBCUTANEOUS

## 2013-02-01 ENCOUNTER — Ambulatory Visit: Payer: Self-pay | Attending: Internal Medicine

## 2013-02-08 ENCOUNTER — Ambulatory Visit (INDEPENDENT_AMBULATORY_CARE_PROVIDER_SITE_OTHER): Payer: No Typology Code available for payment source

## 2013-02-08 DIAGNOSIS — J309 Allergic rhinitis, unspecified: Secondary | ICD-10-CM

## 2013-02-08 DIAGNOSIS — J45909 Unspecified asthma, uncomplicated: Secondary | ICD-10-CM

## 2013-02-10 MED ORDER — OMALIZUMAB 150 MG ~~LOC~~ SOLR
375.0000 mg | Freq: Once | SUBCUTANEOUS | Status: AC
  Administered 2013-02-10: 375 mg via SUBCUTANEOUS

## 2013-02-14 ENCOUNTER — Ambulatory Visit: Payer: No Typology Code available for payment source | Attending: Internal Medicine | Admitting: Internal Medicine

## 2013-02-14 ENCOUNTER — Encounter: Payer: Self-pay | Admitting: Internal Medicine

## 2013-02-14 VITALS — BP 122/78 | HR 65 | Temp 98.6°F | Resp 16 | Ht 67.32 in | Wt 226.0 lb

## 2013-02-14 DIAGNOSIS — T7840XA Allergy, unspecified, initial encounter: Secondary | ICD-10-CM

## 2013-02-14 DIAGNOSIS — J45909 Unspecified asthma, uncomplicated: Secondary | ICD-10-CM | POA: Insufficient documentation

## 2013-02-14 DIAGNOSIS — J339 Nasal polyp, unspecified: Secondary | ICD-10-CM | POA: Insufficient documentation

## 2013-02-14 MED ORDER — FLUTICASONE PROPIONATE 50 MCG/ACT NA SUSP: 2.0000 | Freq: Every day | NASAL | Status: AC

## 2013-02-14 MED ORDER — MONTELUKAST SODIUM 10 MG PO TABS: 10.0000 mg | ORAL_TABLET | Freq: Every day | ORAL | Status: DC

## 2013-02-14 MED ORDER — FLUTICASONE-SALMETEROL 500-50 MCG/DOSE IN AEPB: 1.0000 | INHALATION_SPRAY | Freq: Two times a day (BID) | RESPIRATORY_TRACT | Status: DC

## 2013-02-14 MED ORDER — FLUTICASONE PROPIONATE 50 MCG/ACT NA SUSP: 2.0000 | Freq: Every day | NASAL | Status: DC

## 2013-02-14 NOTE — Progress Notes (Signed)
Patient ID: Mike Beck, male   DOB: 1975-08-04, 37 y.o.   MRN: [id number] Patient Demographics  Mike Beck, is a 37 y.o. male  WUJ:[id number]  [id number]  DOB - 04-27-1976  CC:  Chief Complaint  Patient presents with  . Establish Care       HPI: Mike Beck today is here to establish medical care. He is a 37 years old gentleman with extensive history of allergies and extrinsic asthma on a 2 weekly shot of Omalizumab, he is also on prednisone and other medications for allergy and asthma including Advair, Singulair, beclomethasone and Flonase. He recently so an ENT surgeon who diagnosed her with multiple nasal polyps and he is being scheduled for surgery in the near future. He is here today because he does not have a primary care physician, and he is out of all the inhalers. He has a program with [facility] for free Advair Diskus, one year supply at a time. There is no new complaints today, patient claims to be breathing okay, no nasal discharge, no shortness of breath, no headache, no chest pain. No abdominal pain - No Nausea, No new weakness tingling or numbness, No Cough - SOB.  No Known Allergies Past Medical History  Diagnosis Date  . Hyperlipidemia   . Allergic rhinitis   . Asthma    Current Outpatient Prescriptions on File Prior to Visit  Medication Sig Dispense Refill  . beclomethasone (QVAR) 80 MCG/ACT inhaler Inhale 2 puffs into the lungs 2 (two) times daily.      . Cyanocobalamin (VITAMIN B 12 PO) Take 1 tablet by mouth daily.        . Hypertonic Nasal Wash (SINUS RINSE KIT NA) as directed.        Marland Kitchen LORATADINE PO Take 5 mg by mouth daily.      . Omalizumab (XOLAIR [place]) Inject into the skin. Every 2 weeks       . omalizumab (XOLAIR) 150 MG injection Inject 375 mg into the skin every 14 (fourteen) days.  6 vial  6  . predniSONE (DELTASONE) 5 MG tablet Take as directed  100 tablet  0  . RABEprazole (ACIPHEX) 20 MG tablet Take 1 tablet (20 mg total) by mouth daily.  30 tablet  3   . albuterol (PROVENTIL) (2.5 MG/3ML) 0.083% nebulizer solution Take 2.5 mg by nebulization every 6 (six) hours as needed.      Marland Kitchen albuterol-ipratropium (COMBIVENT) 18-103 MCG/ACT inhaler Inhale 1-2 puffs into the lungs every 6 (six) hours as needed.        . [DISCONTINUED] levocetirizine (XYZAL) 5 MG tablet Take 5 mg by mouth every evening.         No current facility-administered medications on file prior to visit.   Family History  Problem Relation Age of Onset  . Diabetes Father    History   Social History  . Marital Status: Single    Spouse Name: N/A    Number of Children: 3  . Years of Education: N/A   Occupational History  . appliance deliverer for [employer]    Social History Main Topics  . Smoking status: Former Smoker -- 0.50 packs/day for 20 years    Types: Cigarettes    Quit date: 12/06/2008  . Smokeless tobacco: Never Used  . Alcohol Use: No     Comment: quit 10/2008 - was a casual drinker  . Drug Use: Not on file  . Sexual Activity: Not on file   Other Topics Concern  .  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 HENT: Negative for ear pain, nosebleeds, congestion, facial swelling, rhinorrhea, neck pain, neck stiffness and ear discharge.  Eyes: Negative for pain, discharge, redness, itching and visual disturbance. Respiratory: Negative for cough, choking, chest tightness, shortness of breath, wheezing only when out of medications especially prednisone.  Cardiovascular: Negative for chest pain, palpitations and leg swelling. Gastrointestinal: Negative for abdominal distention. Genitourinary: Negative for dysuria, urgency, frequency, hematuria, flank pain, decreased urine volume, difficulty  urinating and dyspareunia.  Musculoskeletal: Negative for back pain, joint swelling, arthralgia and gait problem. Neurological: Negative for dizziness, tremors, seizures, syncope, facial asymmetry, speech difficulty, weakness, light-headedness, numbness and headaches.  Hematological: Negative for adenopathy. Does not bruise/bleed easily. Psychiatric/Behavioral: Negative for hallucinations, behavioral problems, confusion, dysphoric mood, decreased concentration and agitation.    Objective:   Filed Vitals:   02/14/13 1127  BP: 122/78  Pulse: 65  Temp: 98.6 F (37 C)  Resp: 16    Physical Exam: Constitutional: Patient appears well-developed and well-nourished. No distress. HENT: Normocephalic, atraumatic, External right and left ear normal. Oropharynx is clear and moist.  Eyes: Conjunctivae and EOM are normal. PERRLA, no scleral icterus. Neck: Normal ROM. Neck supple. No JVD. No tracheal deviation. No thyromegaly. CVS: RRR, S1/S2 +, no murmurs, no gallops, no carotid bruit.  Pulmonary: Effort and breath sounds normal, no stridor, rhonchi, wheezes, rales.  Abdominal: Soft. BS +, no distension, tenderness, rebound or guarding.  Musculoskeletal: Normal range of motion. No edema and no tenderness.  Lymphadenopathy: No lymphadenopathy noted, cervical, inguinal or axillary Neuro: Alert. Normal reflexes, muscle tone coordination. No cranial nerve deficit. Skin: Skin is warm and dry. No rash noted. Not diaphoretic. No erythema. No pallor. Psychiatric: Normal mood and affect. Behavior, judgment, thought content normal.  Lab Results  Component Value Date   WBC 9.4 05/21/2009   HGB 16.9 05/21/2009   HCT 50.5 05/21/2009   MCV 93.0 05/21/2009   PLT 249 05/21/2009   Lab Results  Component Value Date   CREATININE 1.1 02/16/2011   BUN 13 02/16/2011   NA 143 02/16/2011   K 4.2 02/16/2011   CL 108 02/16/2011   CO2 27 02/16/2011    Lab Results  Component Value Date   HGBA1C 5.8 05/21/2009    Lipid Panel  No results found for this basename: chol, trig, hdl, cholhdl, vldl, ldlcalc       Assessment and plan:   Patient Active Problem List   Diagnosis Date Noted  . Allergy 02/14/2013  . Extrinsic asthma, unspecified 02/14/2013  . Multiple nasal polyps 02/14/2013  . OSA (obstructive sleep apnea) 06/07/2012  . Allergic rhinitis due to pollen 08/04/2010  . SINUSITIS, CHRONIC 08/27/2009  . HYPERLIPIDEMIA 06/06/2009  . EOSINOPHILIA 06/06/2009  . Asthma, allergic 05/21/2009    Plan: Refill the following medications    Advair Diskus  Flonase  Singulair 10 mg tablet by mouth daily Patient encouraged to keep the appointment with the ENT physician, due to cost patient has requested to be reading referred to an ENT locally because his ENT physician is in [place] [facility].  Health Maintenance -Colonoscopy: N/A -Vaccinations:  -TdAP  -PNA (PPSV23) (one dose after 65) (  or one dose before 65 if chronic conditions)  -Zoster (1 dose after 60 yrs)  -Influenza: Today  Follow up in 3 months, prn   The patient was given clear instructions to go to ER or return to medical center if symptoms don't improve, worsen or new problems develop. The patient verbalized understanding. The patient was told to call to get lab results if they haven't heard anything in the next week.   Jeanann Lewandowsky, MD, MHA, FACP [facility] [place], [place] [phone]   02/14/2013, 12:12 PM

## 2013-02-14 NOTE — Progress Notes (Signed)
Pt is here to establish care. Pt is here to f/u with his asthma which he's had for 4 yrs. Needs refill on his medications for asthma. Pt was also examined Feb 01, 2013 findings were nasal polyps.  He states that he will need surgery to help open up his airway and get him off the prednisone.

## 2013-02-22 ENCOUNTER — Ambulatory Visit: Payer: Self-pay

## 2013-02-23 ENCOUNTER — Ambulatory Visit (INDEPENDENT_AMBULATORY_CARE_PROVIDER_SITE_OTHER): Payer: No Typology Code available for payment source

## 2013-02-23 DIAGNOSIS — J309 Allergic rhinitis, unspecified: Secondary | ICD-10-CM

## 2013-02-23 DIAGNOSIS — J45909 Unspecified asthma, uncomplicated: Secondary | ICD-10-CM

## 2013-02-23 MED ORDER — OMALIZUMAB 150 MG ~~LOC~~ SOLR
375.0000 mg | Freq: Once | SUBCUTANEOUS | Status: AC
  Administered 2013-02-23: 375 mg via SUBCUTANEOUS

## 2013-03-03 ENCOUNTER — Ambulatory Visit (INDEPENDENT_AMBULATORY_CARE_PROVIDER_SITE_OTHER): Payer: No Typology Code available for payment source

## 2013-03-03 DIAGNOSIS — J309 Allergic rhinitis, unspecified: Secondary | ICD-10-CM

## 2013-03-08 ENCOUNTER — Ambulatory Visit: Payer: No Typology Code available for payment source

## 2013-03-08 ENCOUNTER — Ambulatory Visit (INDEPENDENT_AMBULATORY_CARE_PROVIDER_SITE_OTHER): Payer: No Typology Code available for payment source

## 2013-03-08 DIAGNOSIS — J309 Allergic rhinitis, unspecified: Secondary | ICD-10-CM

## 2013-03-08 DIAGNOSIS — J45909 Unspecified asthma, uncomplicated: Secondary | ICD-10-CM

## 2013-03-09 MED ORDER — OMALIZUMAB 150 MG ~~LOC~~ SOLR
375.0000 mg | Freq: Once | SUBCUTANEOUS | Status: AC
  Administered 2013-03-09: 375 mg via SUBCUTANEOUS

## 2013-03-22 ENCOUNTER — Ambulatory Visit (INDEPENDENT_AMBULATORY_CARE_PROVIDER_SITE_OTHER): Payer: No Typology Code available for payment source

## 2013-03-22 DIAGNOSIS — J45909 Unspecified asthma, uncomplicated: Secondary | ICD-10-CM

## 2013-03-22 DIAGNOSIS — J309 Allergic rhinitis, unspecified: Secondary | ICD-10-CM

## 2013-03-22 MED ORDER — OMALIZUMAB 150 MG ~~LOC~~ SOLR
375.0000 mg | Freq: Once | SUBCUTANEOUS | Status: AC
  Administered 2013-03-22: 375 mg via SUBCUTANEOUS

## 2013-04-05 ENCOUNTER — Ambulatory Visit (INDEPENDENT_AMBULATORY_CARE_PROVIDER_SITE_OTHER): Payer: No Typology Code available for payment source

## 2013-04-05 DIAGNOSIS — J45909 Unspecified asthma, uncomplicated: Secondary | ICD-10-CM

## 2013-04-05 MED ORDER — OMALIZUMAB 150 MG ~~LOC~~ SOLR
375.0000 mg | Freq: Once | SUBCUTANEOUS | Status: AC
  Administered 2013-04-05: 375 mg via SUBCUTANEOUS

## 2013-04-19 ENCOUNTER — Ambulatory Visit (INDEPENDENT_AMBULATORY_CARE_PROVIDER_SITE_OTHER): Payer: No Typology Code available for payment source

## 2013-04-19 DIAGNOSIS — J309 Allergic rhinitis, unspecified: Secondary | ICD-10-CM

## 2013-04-19 DIAGNOSIS — J45909 Unspecified asthma, uncomplicated: Secondary | ICD-10-CM

## 2013-04-19 DIAGNOSIS — Z23 Encounter for immunization: Secondary | ICD-10-CM

## 2013-04-19 MED ORDER — OMALIZUMAB 150 MG ~~LOC~~ SOLR
300.0000 mg | Freq: Once | SUBCUTANEOUS | Status: AC
  Administered 2013-04-19: 300 mg via SUBCUTANEOUS

## 2013-04-20 ENCOUNTER — Encounter (HOSPITAL_COMMUNITY): Payer: Self-pay | Admitting: Pharmacy Technician

## 2013-04-21 NOTE — H&P (Signed)
Assessment  Asthma (493.90) (J45.909). 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 Allergic rhinitis due to pollen (477.0) (J30.1). Discussed  Chronic asthma and nasal and sinus polyposis. I would like to review the CT that he had a few weeks ago at [facility]. We can consider surgical treatment here in [place] after that. He has been on maximal medical therapy I don't think any additional antibiotics or steroids are warranted. I would recommend he completely avoid caffeine and alcohol. Reason For Visit  Mike Beck is here today at the kind request of Jeanann Lewandowsky for consultation and opinion for sinus polyps. HPI  History of chronic asthma and allergic rhinitis for the past 5 years or so. He was sent to [facility] for a otolaryngology evaluation, had a CT image of the sinuses, and was told he needed to polyp surgery. Lid rather do this here in [place] where he lives. He is on allergy shots and under the care of an allergist. He does not take aspirin although he is not sure if he is allergic. He doesn't smoke, he quit 2 years ago. He denies heartburn. He drinks coffee and soda. He is currently on proton pump inhibitor therapy. Allergies  No Known Drug Allergies. Current Meds  Niacin TABS;; RPT Singulair TABS (Montelukast Sodium);; RPT Aciphex TBEC (RABEprazole Sodium);; RPT Fish Oil CAPS;; RPT Flonase 50 MCG/ACT Nasal Suspension (Fluticasone Propionate);; RPT Advair Diskus MISC;; RPT Loratadine TABS;; RPT PredniSONE TABS;; RPT Flovent HFA AERO;; RPT. Active Problems  Asthma   (493.90) (J45.909) Hypercholesteremia   (272.0) (E78.0). Family Hx  Family history of diabetes mellitus: Mother,Father (V18.0) (Z83.3). 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 ROS  Systemic: Not feeling tired (fatigue).  No fever, no night sweats, and no recent weight loss. Head: Headache. Eyes: Eye symptoms. Otolaryngeal: Hearing  loss, earache, and tinnitus.  No purulent nasal discharge.  Nasal passage blockage (stuffiness), snoring, and sneezing.  No hoarseness.  Sore throat. Cardiovascular: No chest pain or discomfort  and no palpitations. Pulmonary: Dyspnea, cough, and wheezing. Gastrointestinal: No dysphagia  and no heartburn.  No nausea, no abdominal pain, and no melena.  No diarrhea. Genitourinary: No dysuria. Endocrine: No muscle weakness. Musculoskeletal: Calf muscle cramps  and arthralgias.  No soft tissue swelling. Neurological: No dizziness.  Fainting.  No tingling.  Numbness. Psychological: Anxiety.  No depression. Skin: No rash. 12 system ROS was obtained and reviewed on the Health Maintenance form dated today.  Positive responses are shown above.  If the symptom is not checked, the patient has denied it. Vital Signs   Recorded by [facility] on 30 Mar 2013 01:53 PM BP:132/80,  Height: 68 in, Weight: 227 lb, BMI: 34.5 kg/m2,  BMI Calculated: 34.52 ,  BSA Calculated: 2.16. Physical Exam  APPEARANCE: Well developed, well nourished, in no acute distress.  Normal affect, in a pleasant mood.  Oriented to time, place and person. COMMUNICATION: Intermittently raspy voice   HEAD & FACE:  No scars, lesions or masses of head and face.  Sinuses nontender to palpation.  Salivary glands without mass or tenderness.  Facial strength symmetric.  No facial lesion, scars, or mass. EYES: EOMI with normal primary gaze alignment. Visual acuity grossly intact.  PERRLA EXTERNAL EAR & NOSE: No scars, lesions or masses  EAC & TYMPANIC MEMBRANE:  EAC shows no obstructing lesions or debris and tympanic membranes are normal bilaterally with good movement to insufflation. GROSS HEARING: Normal   TMJ:  Nontender  INTRANASAL  EXAM: Mild mucosal congestion seen anteriorly. Yellow exudate seen posteriorly on the left side around the area of the middle meatus.Marland Kitchen  NASOPHARYNX: Normal, without lesions. LIPS, TEETH & GUMS: No lip  lesions, normal dentition and normal gums. ORAL CAVITY/OROPHARYNX:  Oral mucosa moist without lesion or asymmetry of the palate, tongue, tonsil or posterior pharynx. NECK:  Supple without adenopathy or mass. THYROID:  Normal with no masses palpable.  NEUROLOGIC:  No gross CN deficits. No nystagmus noted.   LYMPHATIC:  No enlarged nodes palpable. Signature  Electronically signed by : Serena Colonel  M.D.; 03/30/2013 2:13 PM EST.

## 2013-04-24 ENCOUNTER — Encounter (HOSPITAL_COMMUNITY)
Admission: RE | Admit: 2013-04-24 | Discharge: 2013-04-24 | Disposition: A | Discharge status: Home / Self Care | Payer: No Typology Code available for payment source | Source: Ambulatory Visit | Attending: Otolaryngology | Admitting: Otolaryngology

## 2013-04-24 ENCOUNTER — Encounter (HOSPITAL_COMMUNITY)
Admission: RE | Admit: 2013-04-24 | Discharge: 2013-04-24 | Disposition: A | Discharge status: Home / Self Care | Payer: No Typology Code available for payment source | Source: Ambulatory Visit | Attending: Anesthesiology | Admitting: Anesthesiology

## 2013-04-24 ENCOUNTER — Encounter (HOSPITAL_COMMUNITY): Payer: Self-pay

## 2013-04-24 DIAGNOSIS — Z01812 Encounter for preprocedural laboratory examination: Secondary | ICD-10-CM | POA: Insufficient documentation

## 2013-04-24 DIAGNOSIS — Z01818 Encounter for other preprocedural examination: Secondary | ICD-10-CM | POA: Insufficient documentation

## 2013-04-24 DIAGNOSIS — Z01810 Encounter for preprocedural cardiovascular examination: Secondary | ICD-10-CM | POA: Insufficient documentation

## 2013-04-24 HISTORY — DX: Gastro-esophageal reflux disease without esophagitis: K21.9

## 2013-04-24 HISTORY — DX: Insomnia, unspecified: G47.00

## 2013-04-24 HISTORY — DX: Anesthesia of skin: R20.0

## 2013-04-24 HISTORY — DX: Pain in unspecified joint: M25.50

## 2013-04-24 HISTORY — DX: Headache: R51

## 2013-04-24 HISTORY — DX: Pneumonia, unspecified organism: J18.9

## 2013-04-24 LAB — CBC
Hemoglobin: 16.5 g/dL (ref 13.0–17.0)
MCH: 32.6 pg (ref 26.0–34.0)
MCV: 94.1 fL (ref 78.0–100.0)
Platelets: 225 10*3/uL (ref 150–400)
RBC: 5.06 MIL/uL (ref 4.22–5.81)
WBC: 13.6 10*3/uL — ABNORMAL HIGH (ref 4.0–10.5)

## 2013-04-24 LAB — BASIC METABOLIC PANEL
CO2: 27 mEq/L (ref 19–32)
Calcium: 9 mg/dL (ref 8.4–10.5)
Creatinine, Ser: 1.27 mg/dL (ref 0.50–1.35)
Glucose, Bld: 93 mg/dL (ref 70–99)
Potassium: 3.9 mEq/L (ref 3.5–5.1)
Sodium: 141 mEq/L (ref 135–145)

## 2013-04-24 NOTE — Pre-Procedure Instructions (Signed)
Mike Beck  04/24/2013   Your procedure is scheduled on:  Wed, Nov 19 @ 11:30 AM  Report to [facility] Short Stay Entrance A  at 9:30 AM.  Call this number if you have problems the morning of surgery: [phone]   Remember:   Do not eat food or drink liquids after midnight.   Take these medicines the morning of surgery with A SIP OF WATER: Albuterol<Bring Your Inhaler With You>,Combivent(if needed),Flonase(Fluticasone),Claritin(Loratadine),Prednisone(Deltasone),and Aciphex(Rabeprazole)               No Aspirin,BC's,Goody's,Aleve,Ibuprofen,Fish Oil,or any Herbal Medications   Do not wear jewelry  Do not wear lotions, powders, or colognes. You may wear deodorant.  Men may shave face and neck.  Do not bring valuables to the hospital.  [facility] is not responsible                  for any belongings or valuables.               Contacts, dentures or bridgework may not be worn into surgery.  Leave suitcase in the car. After surgery it may be brought to your room.  For patients admitted to the hospital, discharge time is determined by your                treatment team.               Patients discharged the day of surgery will not be allowed to drive  home.    Special Instructions: Shower using CHG 2 nights before surgery and the night before surgery.  If you shower the day of surgery use CHG.  Use special wash - you have one bottle of CHG for all showers.  You should use approximately 1/3 of the bottle for each shower.   Please read over the following fact sheets that you were given: Pain Booklet, Coughing and Deep Breathing and Surgical Site Infection Prevention

## 2013-04-24 NOTE — Progress Notes (Addendum)
Pt doesn't have a cardiologist   Denies ever having an echo/stress test/heart cath  Medical Md is Dr.Olugbemiga Jegede with [facility]  Denies EKG or CXR in past yr  Pulmonologist is Dr.Alba

## 2013-04-24 NOTE — Progress Notes (Signed)
04/24/13 0922  OBSTRUCTIVE SLEEP APNEA  Have you ever been diagnosed with sleep apnea through a sleep study? No  Do you snore loudly (loud enough to be heard through closed doors)?  0  Do you often feel tired, fatigued, or sleepy during the daytime? 1  Has anyone observed you stop breathing during your sleep? 1  Do you have, or are you being treated for high blood pressure? 0  BMI more than 35 kg/m2? 1  Age over 38 years old? 0  Neck circumference greater than 40 cm/18 inches? 0  Gender: 1  Obstructive Sleep Apnea Score 4  Score 4 or greater  Results sent to PCP

## 2013-04-25 MED ORDER — CEFAZOLIN SODIUM-DEXTROSE 2-3 GM-% IV SOLR
2.0000 g | INTRAVENOUS | Status: AC
  Administered 2013-04-26: 2 g via INTRAVENOUS
  Filled 2013-04-25: qty 50

## 2013-04-26 ENCOUNTER — Encounter (HOSPITAL_COMMUNITY): Payer: No Typology Code available for payment source | Admitting: Anesthesiology

## 2013-04-26 ENCOUNTER — Ambulatory Visit (HOSPITAL_COMMUNITY)
Admission: RE | Admit: 2013-04-26 | Discharge: 2013-04-26 | Disposition: A | Discharge status: Home / Self Care | Payer: No Typology Code available for payment source | Source: Ambulatory Visit | Attending: Otolaryngology | Admitting: Otolaryngology

## 2013-04-26 ENCOUNTER — Encounter (HOSPITAL_COMMUNITY)
Admission: RE | Disposition: A | Discharge status: Home / Self Care | Payer: Self-pay | Source: Ambulatory Visit | Attending: Otolaryngology

## 2013-04-26 ENCOUNTER — Encounter (HOSPITAL_COMMUNITY): Payer: Self-pay | Admitting: Anesthesiology

## 2013-04-26 ENCOUNTER — Ambulatory Visit (HOSPITAL_COMMUNITY): Payer: No Typology Code available for payment source | Admitting: Anesthesiology

## 2013-04-26 DIAGNOSIS — J329 Chronic sinusitis, unspecified: Secondary | ICD-10-CM | POA: Insufficient documentation

## 2013-04-26 DIAGNOSIS — J301 Allergic rhinitis due to pollen: Secondary | ICD-10-CM | POA: Insufficient documentation

## 2013-04-26 DIAGNOSIS — E7800 Pure hypercholesterolemia, unspecified: Secondary | ICD-10-CM | POA: Insufficient documentation

## 2013-04-26 DIAGNOSIS — J339 Nasal polyp, unspecified: Secondary | ICD-10-CM

## 2013-04-26 DIAGNOSIS — J45909 Unspecified asthma, uncomplicated: Secondary | ICD-10-CM | POA: Insufficient documentation

## 2013-04-26 DIAGNOSIS — Z87891 Personal history of nicotine dependence: Secondary | ICD-10-CM | POA: Insufficient documentation

## 2013-04-26 DIAGNOSIS — J330 Polyp of nasal cavity: Secondary | ICD-10-CM | POA: Insufficient documentation

## 2013-04-26 HISTORY — PX: NASAL SINUS SURGERY: SHX719

## 2013-04-26 SURGERY — SINUS SURGERY, ENDOSCOPIC
Anesthesia: General | Site: Nose | Laterality: Bilateral | Wound class: Clean Contaminated

## 2013-04-26 MED ORDER — ARTIFICIAL TEARS OP OINT
TOPICAL_OINTMENT | OPHTHALMIC | Status: DC | PRN
  Administered 2013-04-26: 1 via OPHTHALMIC

## 2013-04-26 MED ORDER — OXYMETAZOLINE HCL 0.05 % NA SOLN
NASAL | Status: AC
  Filled 2013-04-26: qty 15

## 2013-04-26 MED ORDER — OXYMETAZOLINE HCL 0.05 % NA SOLN
NASAL | Status: DC | PRN
  Administered 2013-04-26 (×2): 2 via NASAL

## 2013-04-26 MED ORDER — OXYMETAZOLINE HCL 0.05 % NA SOLN
NASAL | Status: DC | PRN
  Administered 2013-04-26: 1 via NASAL

## 2013-04-26 MED ORDER — LACTATED RINGERS IV SOLN
INTRAVENOUS | Status: DC | PRN
  Administered 2013-04-26 (×2): via INTRAVENOUS

## 2013-04-26 MED ORDER — PROPOFOL 10 MG/ML IV BOLUS
INTRAVENOUS | Status: DC | PRN
  Administered 2013-04-26: 50 mg via INTRAVENOUS
  Administered 2013-04-26: 200 mg via INTRAVENOUS
  Administered 2013-04-26: 50 mg via INTRAVENOUS

## 2013-04-26 MED ORDER — PROMETHAZINE HCL 25 MG/ML IJ SOLN: 6.2500 mg | INTRAMUSCULAR | Status: DC | PRN

## 2013-04-26 MED ORDER — ONDANSETRON HCL 4 MG/2ML IJ SOLN
INTRAMUSCULAR | Status: DC | PRN
  Administered 2013-04-26: 4 mg via INTRAVENOUS

## 2013-04-26 MED ORDER — MIDAZOLAM HCL 5 MG/5ML IJ SOLN
INTRAMUSCULAR | Status: DC | PRN
  Administered 2013-04-26: 2 mg via INTRAVENOUS

## 2013-04-26 MED ORDER — FENTANYL CITRATE 0.05 MG/ML IJ SOLN
INTRAMUSCULAR | Status: DC | PRN
  Administered 2013-04-26 (×2): 100 ug via INTRAVENOUS
  Administered 2013-04-26: 50 ug via INTRAVENOUS

## 2013-04-26 MED ORDER — BACITRACIN ZINC 500 UNIT/GM EX OINT
TOPICAL_OINTMENT | CUTANEOUS | Status: AC
  Filled 2013-04-26: qty 15

## 2013-04-26 MED ORDER — PROMETHAZINE HCL 25 MG RE SUPP: 25.0000 mg | Freq: Four times a day (QID) | RECTAL | Status: DC | PRN

## 2013-04-26 MED ORDER — GLYCOPYRROLATE 0.2 MG/ML IJ SOLN
INTRAMUSCULAR | Status: DC | PRN
  Administered 2013-04-26: .8 mg via INTRAVENOUS

## 2013-04-26 MED ORDER — LACTATED RINGERS IV SOLN
INTRAVENOUS | Status: DC
  Administered 2013-04-26: 10:00:00 via INTRAVENOUS

## 2013-04-26 MED ORDER — SODIUM CHLORIDE 0.9 % IR SOLN
Status: DC | PRN
  Administered 2013-04-26: 1000 mL

## 2013-04-26 MED ORDER — HYDROCODONE-ACETAMINOPHEN 7.5-325 MG PO TABS: 1.0000 | ORAL_TABLET | Freq: Four times a day (QID) | ORAL | Status: DC | PRN

## 2013-04-26 MED ORDER — OXYCODONE HCL 5 MG PO TABS: 5.0000 mg | ORAL_TABLET | Freq: Once | ORAL | Status: AC | PRN

## 2013-04-26 MED ORDER — CEPHALEXIN 500 MG PO CAPS: 500.0000 mg | ORAL_CAPSULE | Freq: Three times a day (TID) | ORAL | Status: DC

## 2013-04-26 MED ORDER — ROCURONIUM BROMIDE 100 MG/10ML IV SOLN
INTRAVENOUS | Status: DC | PRN
  Administered 2013-04-26: 50 mg via INTRAVENOUS

## 2013-04-26 MED ORDER — LIDOCAINE HCL (CARDIAC) 20 MG/ML IV SOLN
INTRAVENOUS | Status: DC | PRN
  Administered 2013-04-26: 100 mg via INTRAVENOUS

## 2013-04-26 MED ORDER — LIDOCAINE-EPINEPHRINE 1 %-1:100000 IJ SOLN
INTRAMUSCULAR | Status: DC | PRN
  Administered 2013-04-26: 20 mL

## 2013-04-26 MED ORDER — HYDROMORPHONE HCL PF 1 MG/ML IJ SOLN
INTRAMUSCULAR | Status: AC
  Administered 2013-04-26: 0.5 mg via INTRAVENOUS
  Filled 2013-04-26: qty 1

## 2013-04-26 MED ORDER — OXYCODONE HCL 5 MG/5ML PO SOLN
5.0000 mg | Freq: Once | ORAL | Status: AC | PRN
  Administered 2013-04-26: 5 mg via ORAL

## 2013-04-26 MED ORDER — LIDOCAINE-EPINEPHRINE 1 %-1:100000 IJ SOLN
INTRAMUSCULAR | Status: AC
  Filled 2013-04-26: qty 1

## 2013-04-26 MED ORDER — ALBUTEROL SULFATE HFA 108 (90 BASE) MCG/ACT IN AERS
INHALATION_SPRAY | RESPIRATORY_TRACT | Status: DC | PRN
  Administered 2013-04-26: 2 via RESPIRATORY_TRACT

## 2013-04-26 MED ORDER — DEXAMETHASONE SODIUM PHOSPHATE 10 MG/ML IJ SOLN
INTRAMUSCULAR | Status: DC | PRN
  Administered 2013-04-26: 8 mg via INTRAVENOUS

## 2013-04-26 MED ORDER — NEOSTIGMINE METHYLSULFATE 1 MG/ML IJ SOLN
INTRAMUSCULAR | Status: DC | PRN
  Administered 2013-04-26: 5 mg via INTRAVENOUS

## 2013-04-26 MED ORDER — OXYCODONE HCL 5 MG/5ML PO SOLN
ORAL | Status: AC
Start: 2013-04-26 — End: 2013-04-26
  Administered 2013-04-26: 5 mg via ORAL
  Filled 2013-04-26: qty 5

## 2013-04-26 MED ORDER — HYDROMORPHONE HCL PF 1 MG/ML IJ SOLN
0.2500 mg | INTRAMUSCULAR | Status: DC | PRN
  Administered 2013-04-26 (×2): 0.5 mg via INTRAVENOUS

## 2013-04-26 SURGICAL SUPPLY — 35 items
ATTRACTOMAT 16X20 MAGNETIC DRP (DRAPES) IMPLANT
BLADE RAD40 ROTATE 4M 4 5PK (BLADE) IMPLANT
BLADE RAD60 ROTATE M4 4 5PK (BLADE) IMPLANT
BLADE TRICUT ROTATE M4 4 5PK (BLADE) ×2 IMPLANT
CANISTER SUCTION 2500CC (MISCELLANEOUS) ×2 IMPLANT
CLOTH BEACON ORANGE TIMEOUT ST (SAFETY) IMPLANT
DRESSING NASAL KENNEDY 3.5X.9 (MISCELLANEOUS) IMPLANT
DRSG NASAL KENNEDY 3.5X.9 (MISCELLANEOUS)
DRSG NASOPORE 8CM (GAUZE/BANDAGES/DRESSINGS) ×2 IMPLANT
ELECT REM PT RETURN 9FT ADLT (ELECTROSURGICAL)
ELECTRODE REM PT RTRN 9FT ADLT (ELECTROSURGICAL) IMPLANT
FILTER ARTHROSCOPY CONVERTOR (FILTER) ×2 IMPLANT
GLOVE BIOGEL PI IND STRL 7.0 (GLOVE) ×1 IMPLANT
GLOVE BIOGEL PI INDICATOR 7.0 (GLOVE) ×1
GLOVE ECLIPSE 7.5 STRL STRAW (GLOVE) ×2 IMPLANT
GLOVE SURG SS PI 7.0 STRL IVOR (GLOVE) ×2 IMPLANT
GOWN STRL NON-REIN LRG LVL3 (GOWN DISPOSABLE) ×4 IMPLANT
KIT BASIN OR (CUSTOM PROCEDURE TRAY) ×2 IMPLANT
KIT ROOM TURNOVER OR (KITS) ×2 IMPLANT
NEEDLE 27GAX1X1/2 (NEEDLE) ×2 IMPLANT
NS IRRIG 1000ML POUR BTL (IV SOLUTION) ×2 IMPLANT
PAD ARMBOARD 7.5X6 YLW CONV (MISCELLANEOUS) ×4 IMPLANT
PATTIES SURGICAL .5 X3 (DISPOSABLE) ×2 IMPLANT
SHEATH ENDOSCRUB 0 DEG (SHEATH) IMPLANT
SHEATH ENDOSCRUB 30 DEG (SHEATH) IMPLANT
SPECIMEN JAR SMALL (MISCELLANEOUS) ×2 IMPLANT
SWAB COLLECTION DEVICE MRSA (MISCELLANEOUS) IMPLANT
SYR 50ML SLIP (SYRINGE) IMPLANT
TOWEL OR 17X24 6PK STRL BLUE (TOWEL DISPOSABLE) ×2 IMPLANT
TOWEL OR 17X26 10 PK STRL BLUE (TOWEL DISPOSABLE) ×2 IMPLANT
TRAY ENT MC OR (CUSTOM PROCEDURE TRAY) ×2 IMPLANT
TUBE ANAEROBIC SPECIMEN COL (MISCELLANEOUS) IMPLANT
TUBE CONNECTING 12X1/4 (SUCTIONS) ×2 IMPLANT
TUBING EXTENTION W/L.L. (IV SETS) ×2 IMPLANT
WATER STERILE IRR 1000ML POUR (IV SOLUTION) IMPLANT

## 2013-04-26 NOTE — Anesthesia Procedure Notes (Signed)
Procedure Name: Intubation Date/Time: 04/26/2013 11:25 AM Performed by: Sherie Don Pre-anesthesia Checklist: Patient identified, Emergency Drugs available, Suction available, Patient being monitored and Timeout performed Patient Re-evaluated:Patient Re-evaluated prior to inductionOxygen Delivery Method: Circle system utilized Preoxygenation: Pre-oxygenation with 100% oxygen Intubation Type: IV induction Ventilation: Mask ventilation without difficulty Laryngoscope Size: Mac and 4 Grade View: Grade II Tube type: Oral Tube size: 7.5 mm Number of attempts: 1 Airway Equipment and Method: Stylet Placement Confirmation: ETT inserted through vocal cords under direct vision,  positive ETCO2 and breath sounds checked- equal and bilateral Secured at: 22 cm Tube secured with: Tape Dental Injury: Teeth and Oropharynx as per pre-operative assessment

## 2013-04-26 NOTE — Transfer of Care (Signed)
Immediate Anesthesia Transfer of Care Note  Patient: Mike Beck  Procedure(s) Performed: Procedure(s): ENDOSCOPIC ETHMOID AND MAXILLARY SINUS SURGERY/BILATERAL POLYPECTOMY (Bilateral)  Patient Location: PACU  Anesthesia Type:General  Level of Consciousness: oriented and patient cooperative  Airway & Oxygen Therapy: Patient Spontanous Breathing and Patient connected to face mask oxygen  Post-op Assessment: Report given to PACU RN and Post -op Vital signs reviewed and stable  Post vital signs: Reviewed and stable  Complications: No apparent anesthesia complications

## 2013-04-26 NOTE — Anesthesia Postprocedure Evaluation (Signed)
  Anesthesia Post-op Note  Patient: Mike Beck  Procedure(s) Performed: Procedure(s): ENDOSCOPIC ETHMOID AND MAXILLARY SINUS SURGERY/BILATERAL POLYPECTOMY (Bilateral)  Patient Location: PACU  Anesthesia Type:General  Level of Consciousness: awake  Airway and Oxygen Therapy: Patient Spontanous Breathing  Post-op Pain: mild  Post-op Assessment: Post-op Vital signs reviewed, Patient's Cardiovascular Status Stable, Respiratory Function Stable, Patent Airway, No signs of Nausea or vomiting and Pain level controlled  Post-op Vital Signs: Reviewed and stable  Complications: No apparent anesthesia complications

## 2013-04-26 NOTE — Anesthesia Preprocedure Evaluation (Addendum)
Anesthesia Evaluation  Patient identified by MRN, date of birth, ID band Patient awake    Reviewed: Allergy & Precautions, H&P , NPO status , Patient's Chart, lab work & pertinent test results  Airway Mallampati: III TM Distance: >3 FB Neck ROM: Full    Dental  (+) Dental Advisory Given   Pulmonary asthma , sleep apnea , former smoker,          Cardiovascular     Neuro/Psych    GI/Hepatic   Endo/Other    Renal/GU      Musculoskeletal   Abdominal   Peds  Hematology   Anesthesia Other Findings   Reproductive/Obstetrics                          Anesthesia Physical Anesthesia Plan  ASA: III  Anesthesia Plan: General   Post-op Pain Management:    Induction: Intravenous  Airway Management Planned: Oral ETT  Additional Equipment:   Intra-op Plan:   Post-operative Plan: Extubation in OR  Informed Consent: I have reviewed the patients History and Physical, chart, labs and discussed the procedure including the risks, benefits and alternatives for the proposed anesthesia with the patient or authorized representative who has indicated his/her understanding and acceptance.   Dental advisory given  Plan Discussed with:   Anesthesia Plan Comments:         Anesthesia Quick Evaluation

## 2013-04-26 NOTE — Preoperative (Signed)
Beta Blockers   Reason not to administer Beta Blockers:Not Applicable 

## 2013-04-26 NOTE — Interval H&P Note (Signed)
History and Physical Interval Note:  04/26/2013 10:47 AM  Mike Beck  has presented today for surgery, with the diagnosis of chronic sinusitis  The various methods of treatment have been discussed with the patient and family. After consideration of risks, benefits and other options for treatment, the patient has consented to  Procedure(s): ENDOSCOPIC ETHMOID AND MAXILLARY SINUS SURGERY/BILATERAL POLYPECTOMY (Bilateral) as a surgical intervention .  The patient's history has been reviewed, patient examined, no change in status, stable for surgery.  I have reviewed the patient's chart and labs.  Questions were answered to the patient's satisfaction.     Michale Emmerich

## 2013-04-26 NOTE — Op Note (Signed)
OPERATIVE REPORT  DATE OF SURGERY: 04/26/2013  PATIENT:  Mike Beck,  37 y.o. male  PRE-OPERATIVE DIAGNOSIS:  chronic sinusitis  POST-OPERATIVE DIAGNOSIS:  * No post-op diagnosis entered *  PROCEDURE:  Procedure(s): ENDOSCOPIC ETHMOID AND MAXILLARY SINUS SURGERY/BILATERAL POLYPECTOMY, right endoscopic sphenoidotomy  SURGEON:  Susy Frizzle, MD  ASSISTANTS: none  ANESTHESIA:   General   EBL:  100 ml  DRAINS: none  LOCAL MEDICATIONS USED:  1% Xylocaine with epinephrine  SPECIMEN:  Bilateral nasal and sinus contents  COUNTS:  Correct  PROCEDURE DETAILS: The patient was taken to the operating room and placed on the operating table in the supine position. Following induction of general endotracheal anesthesia, the face was draped in a standard fashion. Oxymetazoline spray was used preoperatively in the nasal cavities. 1% Xylocaine with epinephrine was infiltrated into the superior and posterior attachments of the middle turbinates bilaterally. The lateral nasal wall and the polypoid mass found within the middle meatus was also infiltrated.  1-bilateral endoscopic polypectomy. Using a 0 nasal endoscope and a microdebrider, the nasal polyps were debrider completely. This exposed nicely the middle meatus and the middle turbinate bilaterally which was initially not completely visible. There did not appear to be polyps in the superior meatus.  2-bilateral endoscopic total ethmoidectomy. After the initial polypectomy was completed, the bulla was opened bilaterally. An extensive and complete ethmoid dissection was accomplished using the microdebrider, 0 and 30 nasal endoscope, and up-biting Wilde-Blakesley forceps. There is extensive polypoid disease filling the entire anterior ethmoid complex bilaterally with some polypoid disease in the posterior ethmoids as well. The ground lamella was taken down bilaterally to expose the posterior cells. The lateral extent of dissection was the lamina  papyracea which was kept intact bilaterally. The superior extent was the fovea which was also kept intact. There were no frontal sinuses developed so further dissection anteriorly revealed polypoid disease in the anterior roof of the ethmoid but no passage into the frontal existed. The ethmoid cavities were packed with NasalPore dressing bilaterally.  3-bilateral endoscopic maxillary antrostomy with removal of tissue. After the ethmoid dissection was completed, on both sides, a 30 endoscope was used to inspect the lateral nasal wall. The fontanelle was palpated with a curved suction and the maxillary sinus was entered bilaterally. A large antrostomy was accomplished using the microdebrider posteriorly, and the backbiter anteriorly. There is extensive polypoid disease within the maxillary sinuses bilaterally. This was cleaned out using a combination of suction, angled forceps, and curettes.  4-right endoscopic sphenoidotomy. The right sphenoid ostium was filled with polypoid disease. This was debrided open and cleaned of polypoid tissue using the microdebrider.  Nasal cavities, pharynx were suctioned of blood and secretions. Afrin pledgets were placed bilaterally until he was extubated. He was extubated and transferred to recovery in stable condition.    PATIENT DISPOSITION:  To PACU, stable

## 2013-04-28 ENCOUNTER — Encounter (HOSPITAL_COMMUNITY): Payer: Self-pay | Admitting: Otolaryngology

## 2013-05-03 ENCOUNTER — Ambulatory Visit (INDEPENDENT_AMBULATORY_CARE_PROVIDER_SITE_OTHER): Payer: No Typology Code available for payment source

## 2013-05-03 DIAGNOSIS — J309 Allergic rhinitis, unspecified: Secondary | ICD-10-CM

## 2013-05-03 DIAGNOSIS — J45909 Unspecified asthma, uncomplicated: Secondary | ICD-10-CM

## 2013-05-03 MED ORDER — OMALIZUMAB 150 MG ~~LOC~~ SOLR
375.0000 mg | Freq: Once | SUBCUTANEOUS | Status: AC
  Administered 2013-05-03: 375 mg via SUBCUTANEOUS

## 2013-05-12 ENCOUNTER — Encounter: Payer: Self-pay | Admitting: Pulmonary Disease

## 2013-05-17 ENCOUNTER — Ambulatory Visit: Payer: No Typology Code available for payment source

## 2013-06-07 ENCOUNTER — Ambulatory Visit (INDEPENDENT_AMBULATORY_CARE_PROVIDER_SITE_OTHER): Payer: No Typology Code available for payment source

## 2013-06-07 DIAGNOSIS — J45909 Unspecified asthma, uncomplicated: Secondary | ICD-10-CM

## 2013-06-07 DIAGNOSIS — J309 Allergic rhinitis, unspecified: Secondary | ICD-10-CM

## 2013-06-12 MED ORDER — OMALIZUMAB 150 MG ~~LOC~~ SOLR
375.0000 mg | Freq: Once | SUBCUTANEOUS | Status: AC
  Administered 2013-06-12: 375 mg via SUBCUTANEOUS

## 2013-06-21 ENCOUNTER — Ambulatory Visit: Payer: No Typology Code available for payment source

## 2013-06-27 ENCOUNTER — Ambulatory Visit: Payer: No Typology Code available for payment source

## 2013-07-17 ENCOUNTER — Other Ambulatory Visit: Payer: Self-pay | Admitting: Internal Medicine

## 2013-07-18 ENCOUNTER — Telehealth: Payer: Self-pay | Admitting: Internal Medicine

## 2013-07-18 DIAGNOSIS — J339 Nasal polyp, unspecified: Secondary | ICD-10-CM

## 2013-07-18 DIAGNOSIS — J45909 Unspecified asthma, uncomplicated: Secondary | ICD-10-CM

## 2013-07-18 MED ORDER — FLUTICASONE-SALMETEROL 500-50 MCG/DOSE IN AEPB: 1.0000 | INHALATION_SPRAY | Freq: Two times a day (BID) | RESPIRATORY_TRACT | Status: DC

## 2013-07-18 NOTE — Telephone Encounter (Signed)
I have called [facility]. They need a new rx for pt's Advair. This has been faxed to them. Samples will be left at the front desk for pt. Processing can take up to 5 business days [facility]. Pt is aware of this information.

## 2013-09-10 ENCOUNTER — Encounter (HOSPITAL_COMMUNITY): Payer: Self-pay | Admitting: Emergency Medicine

## 2013-09-10 ENCOUNTER — Emergency Department (HOSPITAL_COMMUNITY)
Admission: EM | Admit: 2013-09-10 | Discharge: 2013-09-11 | Disposition: A | Discharge status: Home / Self Care | Payer: No Typology Code available for payment source | Attending: Emergency Medicine | Admitting: Emergency Medicine

## 2013-09-10 DIAGNOSIS — R209 Unspecified disturbances of skin sensation: Secondary | ICD-10-CM | POA: Insufficient documentation

## 2013-09-10 DIAGNOSIS — H81399 Other peripheral vertigo, unspecified ear: Secondary | ICD-10-CM | POA: Insufficient documentation

## 2013-09-10 DIAGNOSIS — Z87891 Personal history of nicotine dependence: Secondary | ICD-10-CM | POA: Insufficient documentation

## 2013-09-10 DIAGNOSIS — IMO0002 Reserved for concepts with insufficient information to code with codable children: Secondary | ICD-10-CM | POA: Insufficient documentation

## 2013-09-10 DIAGNOSIS — J45909 Unspecified asthma, uncomplicated: Secondary | ICD-10-CM | POA: Insufficient documentation

## 2013-09-10 DIAGNOSIS — E785 Hyperlipidemia, unspecified: Secondary | ICD-10-CM | POA: Insufficient documentation

## 2013-09-10 DIAGNOSIS — K219 Gastro-esophageal reflux disease without esophagitis: Secondary | ICD-10-CM | POA: Insufficient documentation

## 2013-09-10 DIAGNOSIS — Z79899 Other long term (current) drug therapy: Secondary | ICD-10-CM | POA: Insufficient documentation

## 2013-09-10 DIAGNOSIS — Z8701 Personal history of pneumonia (recurrent): Secondary | ICD-10-CM | POA: Insufficient documentation

## 2013-09-10 LAB — DIFFERENTIAL
Basophils Absolute: 0.1 10*3/uL (ref 0.0–0.1)
Basophils Relative: 0 % (ref 0–1)
Eosinophils Absolute: 1 10*3/uL — ABNORMAL HIGH (ref 0.0–0.7)
Eosinophils Relative: 8 % — ABNORMAL HIGH (ref 0–5)
LYMPHS PCT: 21 % (ref 12–46)
Lymphs Abs: 2.8 10*3/uL (ref 0.7–4.0)
MONO ABS: 0.7 10*3/uL (ref 0.1–1.0)
Monocytes Relative: 5 % (ref 3–12)
NEUTROS ABS: 8.9 10*3/uL — AB (ref 1.7–7.7)
Neutrophils Relative %: 66 % (ref 43–77)

## 2013-09-10 LAB — COMPREHENSIVE METABOLIC PANEL
ALT: 27 U/L (ref 0–53)
AST: 19 U/L (ref 0–37)
Albumin: 4 g/dL (ref 3.5–5.2)
Alkaline Phosphatase: 68 U/L (ref 39–117)
BUN: 9 mg/dL (ref 6–23)
CALCIUM: 9.5 mg/dL (ref 8.4–10.5)
CO2: 23 mEq/L (ref 19–32)
CREATININE: 0.94 mg/dL (ref 0.50–1.35)
Chloride: 102 mEq/L (ref 96–112)
GFR calc Af Amer: >90 mL/min (ref >90)
Glucose, Bld: 107 mg/dL — ABNORMAL HIGH (ref 70–99)
Potassium: 3.7 mEq/L (ref 3.7–5.3)
Sodium: 142 mEq/L (ref 137–147)
Total Bilirubin: 0.7 mg/dL (ref 0.3–1.2)
Total Protein: 7.8 g/dL (ref 6.0–8.3)

## 2013-09-10 LAB — CBC
HEMATOCRIT: 47 % (ref 39.0–52.0)
Hemoglobin: 16.5 g/dL (ref 13.0–17.0)
MCH: 31.7 pg (ref 26.0–34.0)
MCHC: 35.1 g/dL (ref 30.0–36.0)
MCV: 90.2 fL (ref 78.0–100.0)
Platelets: 216 10*3/uL (ref 150–400)
RBC: 5.21 MIL/uL (ref 4.22–5.81)
RDW: 12.6 % (ref 11.5–15.5)
WBC: 13.4 10*3/uL — AB (ref 4.0–10.5)

## 2013-09-10 LAB — I-STAT TROPONIN, ED: Troponin i, poc: 0 ng/mL (ref 0.00–0.08)

## 2013-09-10 LAB — CBG MONITORING, ED: GLUCOSE-CAPILLARY: 101 mg/dL — AB (ref 70–99)

## 2013-09-10 MED ORDER — MECLIZINE HCL 25 MG PO TABS
25.0000 mg | ORAL_TABLET | Freq: Once | ORAL | Status: AC
  Administered 2013-09-10: 25 mg via ORAL
  Filled 2013-09-10: qty 1

## 2013-09-10 MED ORDER — SODIUM CHLORIDE 0.9 % IV BOLUS (SEPSIS)
1000.0000 mL | Freq: Once | INTRAVENOUS | Status: AC
  Administered 2013-09-10: 1000 mL via INTRAVENOUS

## 2013-09-10 NOTE — ED Provider Notes (Signed)
CSN: [id number]     Arrival date & time 09/10/13  1830 History   First MD Initiated Contact with Patient 09/10/13 2227     Chief Complaint  Patient presents with  . Dizziness     (Consider location/radiation/quality/duration/timing/severity/associated sxs/prior Treatment) HPI  38 year old male with history of HLD, GERD, asthma, presents for evaluations of dizziness. Patient states for the past 3 days he has had intermittent bouts of dizziness which he described as a lightheadedness sensation worsening with quick head movement, and with positional change.  It has been waxing and waning but today it has been more severe. Sts he would be driving in his car, turn his head and felt dizzy.  Denies room spinning sensation, more of lightheadedness.  Report both hands would be tingling and he has body tremors.  He denies having any fever, chills, headache, double vision, runny nose, sneezing, sore throat, neck pain, chest pain, shortness of breath, abdominal pain, nausea vomiting diarrhea, or rash. He denies any focal weakness. He was former smoker. No specific treatment tried. Patient has been eating and drinking as usual. No recent medication changes  Past Medical History  Diagnosis Date  . Hyperlipidemia   . Allergic rhinitis     uses Flonase daily  . GERD (gastroesophageal reflux disease)     takes Aciphex daily  . Asthma     uses Albuterol and Combivent prn;Advair daily and Prednisone daily  . Hyperlipidemia     was on Niacin but stopped 2wks ago per MD  . Pneumonia     hx of;51yrs ago  . Headache(784.0)     at times  . Numbness     sometimes in both arms  . Joint pain     knees and in the winter  . Insomnia     sometimes but no meds required   Past Surgical History  Procedure Laterality Date  . None    . Nasal sinus surgery Bilateral 04/26/2013    Procedure: ENDOSCOPIC ETHMOID AND MAXILLARY SINUS SURGERY/BILATERAL POLYPECTOMY;  Surgeon: Serena Colonel, MD;  Location: [facility] OR;  Service:  ENT;  Laterality: Bilateral;   Family History  Problem Relation Age of Onset  . Diabetes Father    History  Substance Use Topics  . Smoking status: Former Smoker -- 0.50 packs/day for 20 years    Types: Cigarettes    Quit date: 12/06/2008  . Smokeless tobacco: Never Used  . Alcohol Use: Yes     Comment: social    Review of Systems  All other systems reviewed and are negative.      Allergies  Review of patient's allergies indicates no known allergies.  Home Medications   Current Outpatient Rx  Name  Route  Sig  Dispense  Refill  . albuterol-ipratropium (COMBIVENT) 18-103 MCG/ACT inhaler   Inhalation   Inhale 1-2 puffs into the lungs every 6 (six) hours as needed for wheezing or shortness of breath.          . Ascorbic Acid (VITAMIN C PO)   Oral   Take 1 tablet by mouth daily.         . beclomethasone (QVAR) 80 MCG/ACT inhaler   Inhalation   Inhale 1 puff into the lungs 2 (two) times daily.         . fluticasone (FLONASE) 50 MCG/ACT nasal spray   Nasal   Place 2 sprays into the nose daily.   16 g   3   . Fluticasone-Salmeterol (ADVAIR) 500-50 MCG/DOSE AEPB  Inhalation   Inhale 1 puff into the lungs 2 (two) times daily.   180 each   3   . loratadine (CLARITIN) 10 MG tablet   Oral   Take 10 mg by mouth daily.         . montelukast (SINGULAIR) 10 MG tablet   Oral   Take 1 tablet (10 mg total) by mouth at bedtime.   30 tablet   3   . Omega-3 Fatty Acids (FISH OIL PO)   Oral   Take 1 capsule by mouth daily.         . predniSONE (DELTASONE) 5 MG tablet   Oral   Take 15 mg by mouth daily with breakfast.         . RABEprazole (ACIPHEX) 20 MG tablet   Oral   Take 1 tablet (20 mg total) by mouth daily.   30 tablet   3   . EXPIRED: albuterol (PROVENTIL) (2.5 MG/3ML) 0.083% nebulizer solution   Nebulization   Take 2.5 mg by nebulization every 6 (six) hours as needed.          BP 130/80  Pulse 70  Temp(Src) 98.7 F (37.1 C) (Oral)   Resp 16  Ht 5\' 8"  (1.727 m)  Wt 228 lb (103.42 kg)  BMI 34.68 kg/m2  SpO2 95% Physical Exam  Nursing note and vitals reviewed. Constitutional: He appears well-developed and well-nourished. No distress.  Awake, alert, nontoxic appearance  HENT:  Head: Atraumatic.  Right Ear: External ear normal.  Left Ear: External ear normal.  Eyes: Conjunctivae and EOM are normal. Pupils are equal, round, and reactive to light. Right eye exhibits no discharge. Left eye exhibits no discharge.  No nystagmus  Neck: Normal range of motion. Neck supple.  Cardiovascular: Normal rate and regular rhythm.   Pulmonary/Chest: Effort normal. No respiratory distress. He exhibits no tenderness.  Abdominal: Soft. There is no tenderness. There is no rebound.  Musculoskeletal: He exhibits no tenderness.  ROM appears intact, no obvious focal weakness  Neurological: He is alert.  Neurologic exam:  Speech clear, pupils equal round reactive to light, extraocular movements intact  Normal peripheral visual fields Cranial nerves III through XII normal including no facial droop Follows commands, moves all extremities x4, normal strength to bilateral upper and lower extremities at all major muscle groups including grip Sensation normal to light touch  Coordination intact, no limb ataxia, finger-nose-finger normal Rapid alternating movements normal No pronator drift Gait normal   Skin: Skin is warm and dry. No rash noted.  Psychiatric: He has a normal mood and affect.    ED Course  Procedures (including critical care time)  Report dizziness with positional change.  Sxs suggestive of lightheadeness or peripheral vertigo.  No focal neuro deficit on exam.  Will give meclizine, IVF, will check orthostatic vital sign.  Low suspicion for central vertigo concerning for stroke.    11:37 PM Normal orthostatic vital sign.  Mildly elevated WBC of 13.4 without left shift.  Pt has leukocytosis documented in the past.  No  source of infection.    12:30 AM Pt able to ambulate without difficulty, UA neg, will have pt f/u with PCP for further care, suspect peripheral vertigo.  Meclizine prescribed.  Care discussed with Dr. Dierdre Highmanpitz.  Labs Review Labs Reviewed  CBC - Abnormal; Notable for the following:    WBC 13.4 (*)    All other components within normal limits  DIFFERENTIAL - Abnormal; Notable for the following:  Neutro Abs 8.9 (*)    Eosinophils Relative 8 (*)    Eosinophils Absolute 1.0 (*)    All other components within normal limits  COMPREHENSIVE METABOLIC PANEL - Abnormal; Notable for the following:    Glucose, Bld 107 (*)    All other components within normal limits  CBG MONITORING, ED - Abnormal; Notable for the following:    Glucose-Capillary 101 (*)    All other components within normal limits  I-STAT TROPOININ, ED   Imaging Review No results found.   EKG Interpretation None      Date: 09/10/2013  Rate:89  Rhythm: normal sinus rhythm  QRS Axis: normal  Intervals: normal  ST/T Wave abnormalities: normal  Conduction Disutrbances: none  Narrative Interpretation:   Old EKG Reviewed: No significant changes noted     MDM   Final diagnoses:  Peripheral vertigo    BP 126/74  Pulse 58  Temp(Src) 98.7 F (37.1 C) (Oral)  Resp 16  Ht 5\' 8"  (1.727 m)  Wt 228 lb (103.42 kg)  BMI 34.68 kg/m2  SpO2 99%     Fayrene Helper, PA-C 09/11/13 0031

## 2013-09-10 NOTE — ED Notes (Signed)
PA at bedside.

## 2013-09-10 NOTE — ED Notes (Signed)
Pt reports dizziness since Friday, states "he feels drunk." increases with certain movements. No acute distress noted at triage. No neuro deficits noted.

## 2013-09-10 NOTE — ED Notes (Signed)
Pt ambulated without difficulties however pt reports he still feels unsteady and dizzy.

## 2013-09-10 NOTE — ED Notes (Signed)
Nurse first rounds : pt. updated on wait time / process and explained delay , respirations unlabored / no distress.

## 2013-09-11 LAB — URINALYSIS, ROUTINE W REFLEX MICROSCOPIC
Bilirubin Urine: NEGATIVE
Glucose, UA: NEGATIVE mg/dL
Hgb urine dipstick: NEGATIVE
KETONES UR: NEGATIVE mg/dL
Leukocytes, UA: NEGATIVE
NITRITE: NEGATIVE
PROTEIN: NEGATIVE mg/dL
Specific Gravity, Urine: 1.015 (ref 1.005–1.030)
UROBILINOGEN UA: 0.2 mg/dL (ref 0.0–1.0)
pH: 6 (ref 5.0–8.0)

## 2013-09-11 MED ORDER — MECLIZINE HCL 50 MG PO TABS: 50.0000 mg | ORAL_TABLET | Freq: Three times a day (TID) | ORAL | Status: DC | PRN

## 2013-09-11 NOTE — Discharge Instructions (Signed)

## 2013-09-12 NOTE — ED Provider Notes (Signed)
Medical screening examination/treatment/procedure(s) were performed by non-physician practitioner and as supervising physician I was immediately available for consultation/collaboration.   Sunnie NielsenBrian Skyy Mcknight, MD 09/12/13 [phone]

## 2013-09-18 ENCOUNTER — Ambulatory Visit: Payer: No Typology Code available for payment source | Attending: Internal Medicine

## 2013-10-06 ENCOUNTER — Ambulatory Visit: Payer: No Typology Code available for payment source | Attending: Family Medicine | Admitting: Family Medicine

## 2013-10-06 ENCOUNTER — Encounter: Payer: Self-pay | Admitting: Family Medicine

## 2013-10-06 ENCOUNTER — Other Ambulatory Visit: Payer: Self-pay | Admitting: Pulmonary Disease

## 2013-10-06 VITALS — BP 127/86 | HR 70 | Temp 98.1°F | Resp 20 | Ht 68.0 in | Wt 230.0 lb

## 2013-10-06 DIAGNOSIS — IMO0002 Reserved for concepts with insufficient information to code with codable children: Secondary | ICD-10-CM | POA: Insufficient documentation

## 2013-10-06 DIAGNOSIS — J45909 Unspecified asthma, uncomplicated: Secondary | ICD-10-CM | POA: Insufficient documentation

## 2013-10-06 MED ORDER — LORATADINE 10 MG PO TABS: 10.0000 mg | ORAL_TABLET | Freq: Every day | ORAL | Status: AC

## 2013-10-06 MED ORDER — BECLOMETHASONE DIPROPIONATE 80 MCG/ACT IN AERS: 1.0000 | INHALATION_SPRAY | Freq: Two times a day (BID) | RESPIRATORY_TRACT | Status: DC

## 2013-10-06 NOTE — Assessment & Plan Note (Signed)
Will follow [facility] plans and get his requested lab tests and Qvar.  Seems well controlled at the prsent on oral steroids

## 2013-10-06 NOTE — Progress Notes (Signed)
   Subjective:    Patient ID: Mike Beck, male    DOB: 18-Jul-1975, 38 y.o.   MRN: [id number]  HPI ASTHMA Patient was referred from [facility] to [facility] was seen there recently and has follow up in June 15 They are prescribing all medications  Disease monitoring Does your asthma bother you are night (coughing or wheezing): No Does your asthma bother you while exercising (coughing or wheezing): Yes Does your asthma keep you from doing anything:  Difficulty working and playing with child due to coughing When were you last in the ER or hospital for asthma: 3 years ago  Medication Management How often do you use your rescue inhaler (albuterol): once monthly How often do you use your steroid inhaler : daily Do you have any mouth soreness: no   Review of Symptoms - see HPI PMH - Smoking status noted.        Review of Systems     Objective:   Physical Exam no apparent distress  Lungs - Normal respiratory effort, chest expands symmetrically. Lungs are clear to auscultation, no crackles or wheezes. Even with forced expiration Heart - Regular rate and rhythm.  No murmurs, gallops or rubs.    Extremities:  No cyanosis, edema, or deformity noted with good range of motion of all major joints.        Assessment & Plan:

## 2013-10-06 NOTE — Progress Notes (Signed)
Patient here to for asthma follow-up. [place] and prednisone increased from 5mg  to 40 mg. Patient indicates that Prednisone helps with controlling wheezing. Patient indicates he was given script by Specialist for Zyflo and labwork; however, was unable to obtain due to cost. Reviewed asthma maintenance and control education and medications. Patient aware.

## 2013-10-06 NOTE — Patient Instructions (Addendum)
Follow-up with [facility] as scheduled. Bloodwork will be taken and sent to Specialist. We will check into QVAR for refill. Ask [facility] Specialist about sleep apnea and CPAP.  Asthma, Adult Asthma is a recurring condition in which the airways tighten and narrow. Asthma can make it difficult to breathe. It can cause coughing, wheezing, and shortness of breath. Asthma episodes (also called asthma attacks) range from minor to life-threatening. Asthma cannot be cured, but medicines and lifestyle changes can help control it. CAUSES Asthma is believed to be caused by inherited (genetic) and environmental factors, but its exact cause is unknown. Asthma may be triggered by allergens, lung infections, or irritants in the air. Asthma triggers are different for each person. Common triggers include:   Animal dander.  Dust mites.  Cockroaches.  Pollen from trees or grass.  Mold.  Smoke.  Air pollutants such as dust, household cleaners, hair sprays, aerosol sprays, paint fumes, strong chemicals, or strong odors.  Cold air, weather changes, and winds (which increase molds and pollens in the air).  Strong emotional expressions such as crying or laughing hard.  Stress.  Certain medicines (such as aspirin) or types of drugs (such as beta-blockers).  Sulfites in foods and drinks. Foods and drinks that may contain sulfites include dried fruit, potato chips, and sparkling grape juice.  Infections or inflammatory conditions such as the flu, a cold, or an inflammation of the nasal membranes (rhinitis).  Gastroesophageal reflux disease (GERD).  Exercise or strenuous activity. SYMPTOMS Symptoms may occur immediately after asthma is triggered or many hours later. Symptoms include:  Wheezing.  Excessive nighttime or early morning coughing.  Frequent or severe coughing with a common cold.  Chest tightness.  Shortness of breath. DIAGNOSIS  The diagnosis of asthma is made by a review of your medical  history and a physical exam. Tests may also be performed. These may include:  Lung function studies. These tests show how much air you breath in and out.  Allergy tests.  Imaging tests such as X-rays. TREATMENT  Asthma cannot be cured, but it can usually be controlled. Treatment involves identifying and avoiding your asthma triggers. It also involves medicines. There are 2 classes of medicine used for asthma treatment:   Controller medicines. These prevent asthma symptoms from occurring. They are usually taken every day.  Reliever or rescue medicines. These quickly relieve asthma symptoms. They are used as needed and provide short-term relief. Your health care provider will help you create an asthma action plan. An asthma action plan is a written plan for managing and treating your asthma attacks. It includes a list of your asthma triggers and how they may be avoided. It also includes information on when medicines should be taken and when their dosage should be changed. An action plan may also involve the use of a device called a peak flow meter. A peak flow meter measures how well the lungs are working. It helps you monitor your condition. HOME CARE INSTRUCTIONS   Take medicine as directed by your health care provider. Speak with your health care provider if you have questions about how or when to take the medicines.  Use a peak flow meter as directed by your health care provider. Record and keep track of readings.  Understand and use the action plan to help minimize or stop an asthma attack without needing to seek medical care.  Control your home environment in the following ways to help prevent asthma attacks:  Do not smoke. Avoid being exposed to  secondhand smoke.  Change your heating and air conditioning filter regularly.  Limit your use of fireplaces and wood stoves.  Get rid of pests (such as roaches and mice) and their droppings.  Throw away plants if you see mold on  them.  Clean your floors and dust regularly. Use unscented cleaning products.  Try to have someone else vacuum for you regularly. Stay out of rooms while they are being vacuumed and for a short while afterward. If you vacuum, use a dust mask from a hardware store, a double-layered or microfilter vacuum cleaner bag, or a vacuum cleaner with a HEPA filter.  Replace carpet with wood, tile, or vinyl flooring. Carpet can trap dander and dust.  Use allergy-proof pillows, mattress covers, and box spring covers.  Wash bed sheets and blankets every week in hot water and dry them in a dryer.  Use blankets that are made of polyester or cotton.  Clean bathrooms and kitchens with bleach. If possible, have someone repaint the walls in these rooms with mold-resistant paint. Keep out of the rooms that are being cleaned and painted.  Wash hands frequently. SEEK MEDICAL CARE IF:   You have wheezing, shortness of breath, or a cough even if taking medicine to prevent attacks.  The colored mucus you cough up (sputum) is thicker than usual.  Your sputum changes from clear or white to yellow, green, gray, or bloody.  You have any problems that may be related to the medicines you are taking (such as a rash, itching, swelling, or trouble breathing).  You are using a reliever medicine more than 2 3 times per week.  Your peak flow is still at 50 79% of you personal best after following your action plan for 1 hour. SEEK IMMEDIATE MEDICAL CARE IF:   You seem to be getting worse and are unresponsive to treatment during an asthma attack.  You are short of breath even at rest.  You get short of breath when doing very little physical activity.  You have difficulty eating, drinking, or talking due to asthma symptoms.  You develop chest pain.  You develop a fast heartbeat.  You have a bluish color to your lips or fingernails.  You are lightheaded, dizzy, or faint.  Your peak flow is less than 50% of  your personal best.  You have a fever or persistent symptoms for more than 2 3 days.  You have a fever and symptoms suddenly get worse. MAKE SURE YOU:   Understand these instructions.  Will watch your condition.  Will get help right away if you are not doing well or get worse. Document Released: 05/25/2005 Document Revised: 01/25/2013 Document Reviewed: 12/22/2012 [facility] Patient Information 2014 [employer] [place].

## 2013-10-09 LAB — PAN-ANCA
Atypical p-ANCA Screen: NEGATIVE
Myeloperoxidase Abs: <1
Serine Protease 3: <1
c-ANCA Screen: NEGATIVE
p-ANCA Screen: NEGATIVE

## 2013-10-26 ENCOUNTER — Ambulatory Visit: Payer: No Typology Code available for payment source | Admitting: Internal Medicine

## 2013-11-28 ENCOUNTER — Other Ambulatory Visit: Payer: Self-pay | Admitting: Internal Medicine

## 2014-01-05 ENCOUNTER — Encounter: Payer: Self-pay | Admitting: Internal Medicine

## 2014-03-28 ENCOUNTER — Other Ambulatory Visit: Payer: Self-pay | Admitting: Internal Medicine

## 2014-04-12 ENCOUNTER — Encounter: Payer: Self-pay | Admitting: Internal Medicine

## 2014-05-22 ENCOUNTER — Ambulatory Visit (INDEPENDENT_AMBULATORY_CARE_PROVIDER_SITE_OTHER): Payer: Self-pay

## 2014-05-22 ENCOUNTER — Ambulatory Visit (INDEPENDENT_AMBULATORY_CARE_PROVIDER_SITE_OTHER): Payer: Self-pay | Admitting: Family Medicine

## 2014-05-22 VITALS — BP 130/90 | HR 74 | Temp 98.9°F | Resp 18 | Ht 68.0 in | Wt 223.8 lb

## 2014-05-22 DIAGNOSIS — R109 Unspecified abdominal pain: Secondary | ICD-10-CM

## 2014-05-22 DIAGNOSIS — R432 Parageusia: Secondary | ICD-10-CM

## 2014-05-22 DIAGNOSIS — N489 Disorder of penis, unspecified: Secondary | ICD-10-CM

## 2014-05-22 DIAGNOSIS — R21 Rash and other nonspecific skin eruption: Secondary | ICD-10-CM

## 2014-05-22 DIAGNOSIS — K088 Other specified disorders of teeth and supporting structures: Secondary | ICD-10-CM

## 2014-05-22 DIAGNOSIS — K089 Disorder of teeth and supporting structures, unspecified: Secondary | ICD-10-CM

## 2014-05-22 LAB — POCT URINALYSIS DIPSTICK
BILIRUBIN UA: NEGATIVE
Blood, UA: NEGATIVE
Glucose, UA: NEGATIVE
Ketones, UA: NEGATIVE
Leukocytes, UA: NEGATIVE
NITRITE UA: NEGATIVE
PH UA: 7
Protein, UA: NEGATIVE
Spec Grav, UA: 1.02
UROBILINOGEN UA: 0.2

## 2014-05-22 LAB — COMPREHENSIVE METABOLIC PANEL
ALK PHOS: 54 U/L (ref 39–117)
ALT: 20 U/L (ref 0–53)
AST: 12 U/L (ref 0–37)
Albumin: 4.5 g/dL (ref 3.5–5.2)
BUN: 13 mg/dL (ref 6–23)
CALCIUM: 9.6 mg/dL (ref 8.4–10.5)
CHLORIDE: 101 meq/L (ref 96–112)
CO2: 28 mEq/L (ref 19–32)
CREATININE: 0.89 mg/dL (ref 0.50–1.35)
Glucose, Bld: 97 mg/dL (ref 70–99)
Potassium: 3.9 mEq/L (ref 3.5–5.3)
Sodium: 140 mEq/L (ref 135–145)
Total Bilirubin: 0.9 mg/dL (ref 0.2–1.2)
Total Protein: 7.6 g/dL (ref 6.0–8.3)

## 2014-05-22 LAB — POCT UA - MICROSCOPIC ONLY
Bacteria, U Microscopic: NEGATIVE
Casts, Ur, LPF, POC: NEGATIVE
Crystals, Ur, HPF, POC: NEGATIVE
Epithelial cells, urine per micros: NEGATIVE
Mucus, UA: NEGATIVE
RBC, urine, microscopic: NEGATIVE
WBC, UR, HPF, POC: NEGATIVE
Yeast, UA: NEGATIVE

## 2014-05-22 LAB — POCT CBC
Granulocyte percent: 87.1 %G — AB (ref 37–80)
HCT, POC: 52.1 % (ref 43.5–53.7)
Hemoglobin: 17.1 g/dL (ref 14.1–18.1)
LYMPH, POC: 2 (ref 0.6–3.4)
MCH, POC: 30.7 pg (ref 27–31.2)
MCHC: 32.9 g/dL (ref 31.8–35.4)
MCV: 93.3 fL (ref 80–97)
MID (CBC): 0.3 (ref 0–0.9)
MPV: 7.6 fL (ref 0–99.8)
POC GRANULOCYTE: 15.5 — AB (ref 2–6.9)
POC LYMPH PERCENT: 11.3 %L (ref 10–50)
POC MID %: 1.6 % (ref 0–12)
Platelet Count, POC: 225 10*3/uL (ref 142–424)
RBC: 5.59 M/uL (ref 4.69–6.13)
RDW, POC: 13.3 %
WBC: 17.8 10*3/uL — AB (ref 4.6–10.2)

## 2014-05-22 MED ORDER — METHOCARBAMOL 750 MG PO TABS: 750.0000 mg | ORAL_TABLET | Freq: Four times a day (QID) | ORAL | Status: DC

## 2014-05-22 MED ORDER — DICLOFENAC SODIUM 75 MG PO TBEC: 75.0000 mg | DELAYED_RELEASE_TABLET | Freq: Two times a day (BID) | ORAL | Status: DC

## 2014-05-22 NOTE — Patient Instructions (Addendum)
Take diclofenac one twice daily for pain and inflammation in back  Take Robaxin 750 mg one pill 3 times daily for muscle relaxant  We are waiting on the results of your other blood tests. I would like to recheck you in one week and recheck your CBC test at that time. If you're still having pain and it is still abnormal we will need to do a CT scan of your abdomen.  If you get worse at any time please come in including things like more pain or fever.  Use over the counter lotrimin cream twice daily for rash on penis.

## 2014-05-22 NOTE — Progress Notes (Signed)
Subjective: Patient has been hurting off and on a month or so in the right mid back.  He has intermittent pain lasting up to several hours, feels deep.  Doesn't knowwhat brings it on. Cannot reproduce it. Continues to gradually worsen.  No known injury. He also has been complaining of a metallic taste in his mouth. Food just doesn't taste right. He has had a history of intermittent asthma. He wondered if he had something on his lungs. He has a little rash under his foreskin that he wants to look at. Also a few little bumps on his penis.  Objective: Throat is clear. He has fairly poor dentition. Neck supple without significant nodes. Chest clear to auscultation. Heart regular without murmurs. Nose CVA tenderness. Good range of motion of his spine. And soft without masses tenderness. He has a mild inflammation of the glans, probably yeast. There are some little prominent follicles on the shaft of the penis. I a could not tell if these are warts, though that is a possibility. Always uses condoms.   Assessment: Deep mid back pain, etiology undetermined Abnormal taste History of asthma Monilia infection of penis Follicular prominence on penis, possibly early warts  Plan: CBC, blood chemistries, STD tests  Results for orders placed or performed in visit on 05/22/14  POCT CBC  Result Value Ref Range   WBC 17.8 (A) 4.6 - 10.2 K/uL   Lymph, poc 2.0 0.6 - 3.4   POC LYMPH PERCENT 11.3 10 - 50 %L   MID (cbc) 0.3 0 - 0.9   POC MID % 1.6 0 - 12 %M   POC Granulocyte 15.5 (A) 2 - 6.9   Granulocyte percent 87.1 (A) 37 - 80 %G   RBC 5.59 4.69 - 6.13 M/uL   Hemoglobin 17.1 14.1 - 18.1 g/dL   HCT, POC 16.152.1 09.643.5 - 53.7 %   MCV 93.3 80 - 97 fL   MCH, POC 30.7 27 - 31.2 pg   MCHC 32.9 31.8 - 35.4 g/dL   RDW, POC 04.513.3 %   Platelet Count, POC 225 142 - 424 K/uL   MPV 7.6 0 - 99.8 fL  POCT UA - Microscopic Only  Result Value Ref Range   WBC, Ur, HPF, POC neg    RBC, urine, microscopic neg    Bacteria,  U Microscopic neg    Mucus, UA neg    Epithelial cells, urine per micros neg    Crystals, Ur, HPF, POC neg    Casts, Ur, LPF, POC neg    Yeast, UA neg   POCT urinalysis dipstick  Result Value Ref Range   Color, UA yellow    Clarity, UA clear    Glucose, UA neg    Bilirubin, UA neg    Ketones, UA neg    Spec Grav, UA 1.020    Blood, UA neg    pH, UA 7.0    Protein, UA neg    Urobilinogen, UA 0.2    Nitrite, UA neg    Leukocytes, UA Negative    Chest x-ray was ordered [facility] reading (PRIMARY) by  Dr. Alwyn RenHopper No acute infiltrate.   Take diclofenac one twice daily for pain and inflammation in back  Take Robaxin 750 mg one pill 3 times daily for muscle relaxant  We are waiting on the results of your other blood tests. I would like to recheck you in one week and recheck your CBC test at that time. If you're still having pain and it is  still abnormal we will need to do a CT scan of your abdomen.  If you get worse at any time please come in including things like more pain or fever.  Use over the counter lotrimin cream twice daily for rash on penis.  If symptoms persist we need to get a CT scan I have asked her come back next week let me recheck him.

## 2014-05-23 LAB — RPR

## 2014-05-23 LAB — HIV ANTIBODY (ROUTINE TESTING W REFLEX): HIV: NONREACTIVE

## 2014-05-23 LAB — GC/CHLAMYDIA PROBE AMP
CT Probe RNA: NEGATIVE
GC Probe RNA: NEGATIVE

## 2014-10-01 ENCOUNTER — Encounter: Payer: Self-pay | Admitting: Internal Medicine

## 2014-10-02 ENCOUNTER — Encounter: Payer: Self-pay | Admitting: Internal Medicine

## 2014-11-29 ENCOUNTER — Telehealth: Payer: Self-pay | Admitting: Internal Medicine

## 2014-11-29 ENCOUNTER — Telehealth: Payer: Self-pay | Admitting: Pulmonary Disease

## 2014-11-29 NOTE — Telephone Encounter (Signed)
Mr.Bellotti stopped all. Shots 04/19/13. Called pt. 12/14 he said he was coming back, he never did. Just to let you know.

## 2014-11-29 NOTE — Telephone Encounter (Addendum)
Mike Beck's last xolair shot was 04/19/13. Sorry, I haven't sent you this before now. We are updating our records. Pt. Was also on allergy vac.. We  Sent Dr.Young a FYI as well. Just wanted you to be aware.  Thanks, [employer]

## 2014-11-29 NOTE — Telephone Encounter (Signed)
Ok - Consider his allergy vaccine dc'd

## 2014-11-30 NOTE — Telephone Encounter (Signed)
Noted  

## 2014-12-04 NOTE — Telephone Encounter (Signed)
Sorry, Mike Beck.Alva I thought I had already sent this to you. I was waiting for your response.

## 2014-12-05 NOTE — Telephone Encounter (Signed)
Please call them and find out how he is doing with his asthma Does he need follow-up ? If so ,pl arrange with TP

## 2014-12-05 NOTE — Telephone Encounter (Signed)
I called Mr.Bok and ask him how he was doing? "The same and [facility], he's in a research trial [facility]. I transferred him to the schedulers and he is going to make an appt. With T. P.

## 2014-12-05 NOTE — Telephone Encounter (Signed)
I checked pt. Made a  F/u appt.. With T.P. 12/28/14 at 3:00. Nothing further needed.

## 2014-12-28 ENCOUNTER — Ambulatory Visit: Payer: Self-pay | Admitting: Adult Health

## 2015-02-23 ENCOUNTER — Encounter (HOSPITAL_COMMUNITY): Payer: Self-pay | Admitting: Emergency Medicine

## 2015-02-23 DIAGNOSIS — Z7951 Long term (current) use of inhaled steroids: Secondary | ICD-10-CM | POA: Insufficient documentation

## 2015-02-23 DIAGNOSIS — Z79899 Other long term (current) drug therapy: Secondary | ICD-10-CM | POA: Insufficient documentation

## 2015-02-23 DIAGNOSIS — R5383 Other fatigue: Secondary | ICD-10-CM | POA: Insufficient documentation

## 2015-02-23 DIAGNOSIS — K219 Gastro-esophageal reflux disease without esophagitis: Secondary | ICD-10-CM | POA: Insufficient documentation

## 2015-02-23 DIAGNOSIS — Z8639 Personal history of other endocrine, nutritional and metabolic disease: Secondary | ICD-10-CM | POA: Insufficient documentation

## 2015-02-23 DIAGNOSIS — H538 Other visual disturbances: Secondary | ICD-10-CM | POA: Insufficient documentation

## 2015-02-23 DIAGNOSIS — Z87891 Personal history of nicotine dependence: Secondary | ICD-10-CM | POA: Insufficient documentation

## 2015-02-23 DIAGNOSIS — Z791 Long term (current) use of non-steroidal anti-inflammatories (NSAID): Secondary | ICD-10-CM | POA: Insufficient documentation

## 2015-02-23 DIAGNOSIS — R51 Headache: Secondary | ICD-10-CM | POA: Insufficient documentation

## 2015-02-23 DIAGNOSIS — Z8701 Personal history of pneumonia (recurrent): Secondary | ICD-10-CM | POA: Insufficient documentation

## 2015-02-23 DIAGNOSIS — M62838 Other muscle spasm: Secondary | ICD-10-CM | POA: Insufficient documentation

## 2015-02-23 DIAGNOSIS — J45909 Unspecified asthma, uncomplicated: Secondary | ICD-10-CM | POA: Insufficient documentation

## 2015-02-23 DIAGNOSIS — Z7952 Long term (current) use of systemic steroids: Secondary | ICD-10-CM | POA: Insufficient documentation

## 2015-02-23 LAB — CBC
HCT: 48.6 % (ref 39.0–52.0)
Hemoglobin: 16.6 g/dL (ref 13.0–17.0)
MCH: 31.9 pg (ref 26.0–34.0)
MCHC: 34.2 g/dL (ref 30.0–36.0)
MCV: 93.3 fL (ref 78.0–100.0)
PLATELETS: 198 10*3/uL (ref 150–400)
RBC: 5.21 MIL/uL (ref 4.22–5.81)
RDW: 12.8 % (ref 11.5–15.5)
WBC: 15.9 10*3/uL — AB (ref 4.0–10.5)

## 2015-02-23 NOTE — ED Notes (Signed)
Pt reports dizziness that started at 5pm with a "tired" feeling in back of head. Reports THursday he had similar episode that lasted 2 hours. Pt reports intermittent diaphoresis.

## 2015-02-24 ENCOUNTER — Emergency Department (HOSPITAL_COMMUNITY): Payer: Self-pay

## 2015-02-24 ENCOUNTER — Encounter (HOSPITAL_COMMUNITY): Payer: Self-pay | Admitting: Emergency Medicine

## 2015-02-24 ENCOUNTER — Emergency Department (HOSPITAL_COMMUNITY)
Admission: EM | Admit: 2015-02-24 | Discharge: 2015-02-24 | Disposition: A | Discharge status: Home / Self Care | Payer: Self-pay | Attending: Emergency Medicine | Admitting: Emergency Medicine

## 2015-02-24 DIAGNOSIS — R519 Headache, unspecified: Secondary | ICD-10-CM

## 2015-02-24 DIAGNOSIS — R51 Headache: Secondary | ICD-10-CM

## 2015-02-24 LAB — BASIC METABOLIC PANEL
ANION GAP: 8 (ref 5–15)
BUN: 9 mg/dL (ref 6–20)
CO2: 25 mmol/L (ref 22–32)
CREATININE: 0.98 mg/dL (ref 0.61–1.24)
Calcium: 9.2 mg/dL (ref 8.9–10.3)
Chloride: 105 mmol/L (ref 101–111)
GFR calc non Af Amer: >60 mL/min (ref >60)
Glucose, Bld: 147 mg/dL — ABNORMAL HIGH (ref 65–99)
Potassium: 4 mmol/L (ref 3.5–5.1)
Sodium: 138 mmol/L (ref 135–145)

## 2015-02-24 LAB — I-STAT TROPONIN, ED: TROPONIN I, POC: 0.01 ng/mL (ref 0.00–0.08)

## 2015-02-24 MED ORDER — ACETAMINOPHEN 500 MG PO TABS
1000.0000 mg | ORAL_TABLET | Freq: Once | ORAL | Status: AC
  Administered 2015-02-24: 1000 mg via ORAL
  Filled 2015-02-24: qty 2

## 2015-02-24 MED ORDER — KETOROLAC TROMETHAMINE 60 MG/2ML IM SOLN
60.0000 mg | Freq: Once | INTRAMUSCULAR | Status: AC
  Administered 2015-02-24: 60 mg via INTRAMUSCULAR
  Filled 2015-02-24: qty 2

## 2015-02-24 MED ORDER — IBUPROFEN 800 MG PO TABS: 800.0000 mg | ORAL_TABLET | Freq: Three times a day (TID) | ORAL | Status: DC

## 2015-02-24 NOTE — ED Notes (Signed)
Pt ambulated in hallway without difficulty.  Steady gait  

## 2015-02-24 NOTE — ED Provider Notes (Signed)
CSN: [id number]     Arrival date & time 02/23/15  2256 History   This chart was scribed for April Palumbo, MD by Arlan Organ, ED Scribe. This patient was seen in room A10C/A10C and the patient's care was started 2:48 AM.   Chief Complaint  Patient presents with  . Dizziness   The history is provided by the patient. No language interpreter was used.    HPI Comments: Mike Beck is a 39 y.o. male with a PMHx of hyperlipidemia who presents to the Emergency Department complaining of intermittent, ongoing dizziness onset 5:00 PM this evening. Dizziness described as room spinning. Previous episode lasted 2 hours 2 days ago.  He also reports recent ongoing fatigue, pressure like HA, and mild blurred vision. No aggravating or alleviating factors at this time. No OTC medications or home remedies attempted prior to arrival for above symptoms. No recent fever, chills, congestion, tinnitus, nausea, vomiting, rhinorrhea, chest pain, or shortness of breath.  Past Medical History  Diagnosis Date  . Hyperlipidemia   . Allergic rhinitis     uses Flonase daily  . GERD (gastroesophageal reflux disease)     takes Aciphex daily  . Asthma     uses Albuterol and Combivent prn;Advair daily and Prednisone daily  . Hyperlipidemia     was on Niacin but stopped 2wks ago per MD  . Pneumonia     hx of;29yrs ago  . Headache(784.0)     at times  . Numbness     sometimes in both arms  . Joint pain     knees and in the winter  . Insomnia     sometimes but no meds required  . Allergy    Past Surgical History  Procedure Laterality Date  . None    . Nasal sinus surgery Bilateral 04/26/2013    Procedure: ENDOSCOPIC ETHMOID AND MAXILLARY SINUS SURGERY/BILATERAL POLYPECTOMY;  Surgeon: Serena Colonel, MD;  Location: [facility] OR;  Service: ENT;  Laterality: Bilateral;   Family History  Problem Relation Age of Onset  . Diabetes Father    Social History  Substance Use Topics  . Smoking status: Former Smoker -- 0.50  packs/day for 20 years    Types: Cigarettes    Quit date: 12/06/2008  . Smokeless tobacco: Never Used  . Alcohol Use: Yes     Comment: social    Review of Systems  Constitutional: Positive for fatigue. Negative for fever and chills.  HENT: Negative for congestion, postnasal drip, rhinorrhea, sinus pressure and sore throat.   Respiratory: Negative for cough and shortness of breath.   Cardiovascular: Negative for chest pain.  Gastrointestinal: Negative for nausea, vomiting, abdominal pain and diarrhea.  Musculoskeletal: Negative for back pain.  Neurological: Positive for dizziness and headaches.  Psychiatric/Behavioral: Negative for confusion.  All other systems reviewed and are negative.     Allergies  Review of patient's allergies indicates no known allergies.  Home Medications   Prior to Admission medications   Medication Sig Start Date End Date Taking? Authorizing Provider  albuterol (PROVENTIL) (2.5 MG/3ML) 0.083% nebulizer solution Take 2.5 mg by nebulization every 6 (six) hours as needed. 12/11/10 02/02/13  Oretha Milch, MD  albuterol-ipratropium (COMBIVENT) 18-103 MCG/ACT inhaler Inhale 1-2 puffs into the lungs every 6 (six) hours as needed for wheezing or shortness of breath.     Historical Provider, MD  Ascorbic Acid (VITAMIN C PO) Take 1 tablet by mouth daily.    Historical Provider, MD  beclomethasone (QVAR) 80 MCG/ACT inhaler Inhale  1 puff into the lungs 2 (two) times daily. Patient not taking: Reported on 05/22/2014 10/06/13   Carney Living, MD  diclofenac (VOLTAREN) 75 MG EC tablet Take 1 tablet (75 mg total) by mouth 2 (two) times daily. 05/22/14   Peyton Najjar, MD  fluticasone (FLONASE) 50 MCG/ACT nasal spray Place 2 sprays into the nose daily. 02/14/13   Quentin Angst, MD  Fluticasone-Salmeterol (ADVAIR) 500-50 MCG/DOSE AEPB Inhale 1 puff into the lungs 2 (two) times daily. 07/18/13   Oretha Milch, MD  loratadine (CLARITIN) 10 MG tablet Take 1 tablet (10  mg total) by mouth daily. 10/06/13   Carney Living, MD  meclizine (ANTIVERT) 50 MG tablet Take 1 tablet (50 mg total) by mouth 3 (three) times daily as needed. Patient not taking: Reported on 05/22/2014 09/11/13   Fayrene Helper, PA-C  methocarbamol (ROBAXIN-750) 750 MG tablet Take 1 tablet (750 mg total) by mouth 4 (four) times daily. 05/22/14   Peyton Najjar, MD  montelukast (SINGULAIR) 10 MG tablet TOME 1 TABLETA POR LA BOCA CADA DIA    Quentin Angst, MD  Omega-3 Fatty Acids (FISH OIL PO) Take 1 capsule by mouth daily.    Historical Provider, MD  predniSONE (DELTASONE) 5 MG tablet Take 5 mg by mouth daily with breakfast.     Historical Provider, MD  RABEprazole (ACIPHEX) 20 MG tablet Take 1 tablet (20 mg total) by mouth daily. 03/26/11   Tammy S Parrett, NP   Triage Vitals: BP 136/84 mmHg  Pulse 67  Temp(Src) 98.8 F (37.1 C) (Oral)  Resp 20  SpO2 98%   Physical Exam  Constitutional: He is oriented to person, place, and time. He appears well-developed and well-nourished.  HENT:  Head: Normocephalic and atraumatic.  Mouth/Throat: Oropharynx is clear and moist. No oropharyngeal exudate.  Eyes: EOM are normal.  Neck: Normal range of motion.  Cardiovascular: Normal rate, regular rhythm, normal heart sounds and intact distal pulses.   Pulmonary/Chest: Effort normal and breath sounds normal. No stridor. No respiratory distress.  Abdominal: Soft. He exhibits no distension. There is no tenderness.  Musculoskeletal: Normal range of motion.  Trapezius spasm on the L  Neurological: He is alert and oriented to person, place, and time.  Cranial nerves 2-12 intact   Skin: Skin is warm and dry.  Psychiatric: He has a normal mood and affect. Judgment normal.  Nursing note and vitals reviewed.   ED Course  Procedures (including critical care time)  DIAGNOSTIC STUDIES: Oxygen Saturation is 98% on RA, Normal by my interpretation.    COORDINATION OF CARE: 2:56 AM- Will order BMP, CBC,  urinalysis, and CBG. Discussed treatment plan with pt at bedside and pt agreed to plan.     Labs Review Labs Reviewed  BASIC METABOLIC PANEL - Abnormal; Notable for the following:    Glucose, Bld 147 (*)    All other components within normal limits  CBC - Abnormal; Notable for the following:    WBC 15.9 (*)    All other components within normal limits  URINALYSIS, ROUTINE W REFLEX MICROSCOPIC (NOT AT [facility])  CBG MONITORING, ED    Imaging Review No results found. I have personally reviewed and evaluated these images and lab results as part of my medical decision-making.   EKG Interpretation None      MDM   Final diagnoses:  None      Visual Acuity  Right Eye Distance:   Left Eye Distance:   Bilateral Distance:  Right Eye Near: R Near: 20/30 Left Eye Near:  L Near: 20/25 Bilateral Near:  20/25   Pain relieved by tylenol   EKG Interpretation  Date/Time:  Saturday February 23 2015 23:33:31 EDT Ventricular Rate:  74 PR Interval:  130 QRS Duration: 94 QT Interval:  386 QTC Calculation: 428 R Axis:   86 Text Interpretation:  Normal sinus rhythm RSR' or QR pattern in V1 suggests right ventricular conduction delay Confirmed by [facility]  MD, APRIL ([id number]) on 02/24/2015 3:12:29 AM      No ataxia normal head CT stable for discharge.  Tylenol and ibuprofen for pain    I personally performed the services described in this documentation, which was scribed in my presence. The recorded information has been reviewed and is accurate.    Cy Blamer, MD 02/24/15 [phone]

## 2015-02-24 NOTE — ED Notes (Signed)
Patient transported to CT 

## 2015-02-26 ENCOUNTER — Ambulatory Visit (INDEPENDENT_AMBULATORY_CARE_PROVIDER_SITE_OTHER): Payer: Self-pay | Admitting: Physician Assistant

## 2015-02-26 ENCOUNTER — Encounter: Payer: Self-pay | Admitting: Physician Assistant

## 2015-02-26 VITALS — BP 116/82 | HR 86 | Temp 98.9°F | Resp 16 | Ht 67.0 in | Wt 229.8 lb

## 2015-02-26 DIAGNOSIS — Z7952 Long term (current) use of systemic steroids: Secondary | ICD-10-CM

## 2015-02-26 DIAGNOSIS — J4550 Severe persistent asthma, uncomplicated: Secondary | ICD-10-CM

## 2015-02-26 DIAGNOSIS — R42 Dizziness and giddiness: Secondary | ICD-10-CM

## 2015-02-26 DIAGNOSIS — J329 Chronic sinusitis, unspecified: Secondary | ICD-10-CM

## 2015-02-26 DIAGNOSIS — Z0000 Encounter for general adult medical examination without abnormal findings: Secondary | ICD-10-CM

## 2015-02-26 DIAGNOSIS — R300 Dysuria: Secondary | ICD-10-CM

## 2015-02-26 DIAGNOSIS — Z13220 Encounter for screening for lipoid disorders: Secondary | ICD-10-CM

## 2015-02-26 DIAGNOSIS — E049 Nontoxic goiter, unspecified: Secondary | ICD-10-CM

## 2015-02-26 DIAGNOSIS — E010 Iodine-deficiency related diffuse (endemic) goiter: Secondary | ICD-10-CM

## 2015-02-26 LAB — POCT URINALYSIS DIP (MANUAL ENTRY)
Bilirubin, UA: NEGATIVE
GLUCOSE UA: NEGATIVE
Ketones, POC UA: NEGATIVE
LEUKOCYTES UA: NEGATIVE
NITRITE UA: NEGATIVE
Protein Ur, POC: NEGATIVE
Spec Grav, UA: 1.015
UROBILINOGEN UA: 0.2
pH, UA: 7.5

## 2015-02-26 LAB — LIPID PANEL
CHOL/HDL RATIO: 6.8 ratio — AB (ref <=5.0)
CHOLESTEROL: 217 mg/dL — AB (ref 125–200)
HDL: 32 mg/dL — ABNORMAL LOW (ref >=40)
LDL Cholesterol: 137 mg/dL — ABNORMAL HIGH (ref <130)
TRIGLYCERIDES: 241 mg/dL — AB (ref <150)
VLDL: 48 mg/dL — AB (ref <30)

## 2015-02-26 LAB — TSH: TSH: 3.271 u[IU]/mL (ref 0.350–4.500)

## 2015-02-26 NOTE — Progress Notes (Signed)
[facility] [address], [place] [address] [phone]  Date:  02/26/2015   Name:  Mike Beck   DOB:  1976/04/20   MRN:  [id number]  PCP:  Jeanann Lewandowsky, MD    Chief Complaint: Annual Exam   History of Present Illness:  This is a 39 y.o. male with PMH asthma, allergic rhinitis who is presenting for CPE.  Complaining of dizziness x 5 days. First episode lasted 2 hours. Came back 2 days later and has been constant since. He is fine when sitting still but returns when he stands or moves his head. States it feels like he's drunk. Room does not spin. Has assoc head pressure. Denies chest pain, SOB, weakness, numbness, fever, chills, malaise. Has some occ blurry vision that preceded dizziness. This has never happened before. He was seen in the ED on 9/17 for evaluation of dizziness. At that time CBC nml (except for leukocytosis d/t chronic prednisone use), Bmet nml except for glucose 147, troponin negative, EKG normal, CT head negative except for chronic paranasal sinus disease.  Pt has severe persistent uncontrolled asthma. Asthma started in 2010 after smoking cessation. He is prednisone dependent, 5 mg QD. He also uses advair BID. Uses albuterol q 2-3 days. He was seeing Dr. Vassie Loll and Dr. Maple Hudson at [facility]. He is now participating in a clinical trial at [facility] and being managed by Dr. Estelle June. Was seeing allergist, Dr. Deirdre Priest for allergy and asthma, but now all meds being prescribed by [facility]. He takes claritin flonase and singulair daily.   He has seen ENT, Dr. Pollyann Kennedy, in the past for chronic sinusitis. He last saw 04/2013 for sinus surgery and polypectomy.   Immunizations: is going to get flu shot somewhere else where it is free Dentist: does not go to the dentist. Eye: never had checked. Diet: eats "whatever". Eats three meals a day. Drinks water, 1 regular soda a day, occ alcohol. Does not drink coffee. Exercise: no exercise. 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 Fam hx: Father with DM, Mom with prediabetes. No hx heart disease or cancer. Sexual hx: yes with wife. States he has a low sex drive ever since his wife became pregnant. She is currently 6 months pregnant. He states he is worried he will hurt the baby. Urinary hesitancy/frequency or nocturia: no nocturia. Pt does complain of occ mild dysuria. Problems with erectile dysfunction: no Tobacco/alcohol/substance use: former smoker quit 2010/occ/no Colonoscopy: N/A  Review of Systems:  Review of Systems See HPI  Patient Active Problem List   Diagnosis Date Noted  . Multiple nasal polyps 02/14/2013  . OSA (obstructive sleep apnea) 06/07/2012  . Allergic rhinitis due to pollen 08/04/2010  . SINUSITIS, CHRONIC 08/27/2009  . HYPERLIPIDEMIA 06/06/2009  . EOSINOPHILIA 06/06/2009  . Asthma, allergic 05/21/2009   Prior to Admission medications   Medication Sig Start Date End Date Taking? Authorizing Provider         Ascorbic Acid (VITAMIN C PO) Take 1 tablet by mouth daily.   Yes Historical Provider, MD  fluticasone (FLONASE) 50 MCG/ACT nasal spray Place 2 sprays into the nose daily. 02/14/13  Yes Quentin Angst, MD  Fluticasone-Salmeterol (ADVAIR) 500-50 MCG/DOSE AEPB Inhale 1 puff into the lungs 2 (two) times daily. 07/18/13  Yes Oretha Milch, MD  loratadine (CLARITIN) 10 MG tablet Take 1 tablet (10 mg total) by mouth daily. 10/06/13  Yes Carney Living, MD  montelukast (SINGULAIR) 10 MG tablet TOME  1 TABLETA POR LA BOCA CADA DIA   Yes Olugbemiga Annitta Needs, MD  Omega-3 Fatty Acids (FISH OIL PO) Take 1 capsule by mouth daily.   Yes Historical Provider, MD  predniSONE (DELTASONE) 5 MG tablet Take 5 mg by mouth daily with breakfast.    Yes Historical Provider, MD  RABEprazole (ACIPHEX) 20 MG tablet Take 1 tablet (20 mg total) by mouth daily. 03/26/11  Yes Tammy S Parrett, NP  albuterol (PROVENTIL) (2.5 MG/3ML) 0.083% nebulizer solution Take 2.5 mg by nebulization every 6  (six) hours as needed. 12/11/10 02/02/13 Yes Oretha Milch, MD      No Known Allergies  Past Surgical History  Procedure Laterality Date  . None    . Nasal sinus surgery Bilateral 04/26/2013    Procedure: ENDOSCOPIC ETHMOID AND MAXILLARY SINUS SURGERY/BILATERAL POLYPECTOMY;  Surgeon: Serena Colonel, MD;  Location: [facility] OR;  Service: ENT;  Laterality: Bilateral;  . Polyps surgery      Social History  Substance Use Topics  . Smoking status: Former Smoker -- 0.50 packs/day for 20 years    Types: Cigarettes    Quit date: 12/06/2008  . Smokeless tobacco: Never Used  . Alcohol Use: No     Comment: social    Family History  Problem Relation Age of Onset  . Diabetes Father   . Diabetes Mother     Medication list has been reviewed and updated.  Physical Examination:  Physical Exam  Constitutional: He is oriented to person, place, and time. He appears well-developed and well-nourished.  HENT:  Head: Normocephalic and atraumatic.  Right Ear: Hearing, tympanic membrane, external ear and ear canal normal.  Left Ear: Hearing, tympanic membrane, external ear and ear canal normal.  Nose: Nose normal.  Mouth/Throat: Uvula is midline, oropharynx is clear and moist and mucous membranes are normal.  Dix-hallpike negative  Eyes: Conjunctivae, EOM and lids are normal. Pupils are equal, round, and reactive to light. Right eye exhibits no discharge. Left eye exhibits no discharge. No scleral icterus.  Some horizontal nystagmus present bilaterally  Neck: Trachea normal and normal range of motion. Neck supple. Carotid bruit is not present. Thyromegaly (right sided, soft) present.  Cardiovascular: Normal rate, regular rhythm, normal heart sounds, intact distal pulses and normal pulses.   No murmur heard. Pulmonary/Chest: Effort normal. No respiratory distress. He has wheezes (scattered throughout). He has no rhonchi. He has no rales.  Abdominal: Soft. Normal appearance and bowel sounds are normal. He  exhibits no abdominal bruit. There is no tenderness.  obese  Musculoskeletal: Normal range of motion. He exhibits no edema or tenderness.  Lymphadenopathy:       Head (right side): No submental, no submandibular, no tonsillar, no preauricular, no posterior auricular and no occipital adenopathy present.       Head (left side): No submental, no submandibular, no tonsillar, no preauricular, no posterior auricular and no occipital adenopathy present.    He has no cervical adenopathy.  Neurological: He is alert and oriented to person, place, and time. He has normal strength and normal reflexes. No cranial nerve deficit or sensory deficit. Coordination and gait normal.  Skin: Skin is warm, dry and intact. No lesion and no rash noted.  No LE edema  Psychiatric: He has a normal mood and affect. His speech is normal and behavior is normal. Judgment and thought content normal.   BP 116/82 mmHg  Pulse 86  Temp(Src) 98.9 F (37.2 C) (Oral)  Resp 16  Ht  (1.702 m)  Wt 229 lb 12.8 oz (104.237 kg)  BMI 35.98 kg/m2  SpO2 98%   Visual Acuity Screening   Right eye Left eye Both eyes  Without correction:  With correction:      Assessment and Plan:  1. Annual physical exam We discussed the importance of exercise and healthy. He feels that he could walk 30 minutes 3 times a day. D/t prednisone dependence, he is at an increased risk for diabetes. He needs to lower risk with exercise and diet.  He will get flu shot at a clinic where it's free.  2. Dizziness 3. Chronic sinusitis At ED visit, work up negative including CBC, bmet, troponin, EKG and CT head. A1C and TSH pending. If all negative, would favor referring back to ENT, Dr. Pollyann Kennedy, for evaluation. Chronic sinusitis could be playing a role. - Hemoglobin A1c - TSH  4. Dysuria - GC/Chlamydia Probe Amp - POCT urinalysis dipstick  5. Lipid screening - Lipid panel  6. Thyromegaly - US Soft Tissue Head/Neck; Future  7.  Asthma, allergic, severe persistent, uncomplicated Follow up with [facility] pulmonology.   [place] Brackett, MHS [facility]  02/28/2015

## 2015-02-26 NOTE — Progress Notes (Signed)
   Subjective:    Patient ID: Mike Beck, male    DOB: [birth year], 39 y.o.   MRN: [id number]  HPI    Review of Systems  Constitutional: Positive for fatigue.  HENT: Positive for congestion.   Eyes: Positive for visual disturbance.  Respiratory: Positive for wheezing.   Cardiovascular: Negative.   Gastrointestinal: Positive for diarrhea and abdominal distention.  Endocrine: Negative.   Genitourinary: Positive for dysuria.  Musculoskeletal: Negative.   Skin: Negative.   Allergic/Immunologic: Positive for environmental allergies.  Neurological: Positive for dizziness.  Hematological: Negative.   Psychiatric/Behavioral: The patient is nervous/anxious.        Objective:   Physical Exam        Assessment & Plan:

## 2015-02-26 NOTE — Patient Instructions (Signed)
I will call you with your lab results Try lamisil on your rash once a day for 2 weeks. If everything is negative, I will refer you to Dr. Pollyann Kennedy. Be careful with your diet. Stay away from white foods (pasta, rice, potatoes, bread), sugary beverages and sweets. Start exercising. Walk for exercise 3 days a week. You will get a phone call to make appt for thyroid ultrasound. Return with further concerns.

## 2015-02-27 LAB — GC/CHLAMYDIA PROBE AMP
CT Probe RNA: NEGATIVE
GC Probe RNA: NEGATIVE

## 2015-02-27 LAB — HEMOGLOBIN A1C
Hgb A1c MFr Bld: 5.9 % — ABNORMAL HIGH (ref <5.7)
Mean Plasma Glucose: 123 mg/dL — ABNORMAL HIGH (ref <117)

## 2015-02-28 DIAGNOSIS — Z7952 Long term (current) use of systemic steroids: Secondary | ICD-10-CM | POA: Insufficient documentation

## 2015-03-04 ENCOUNTER — Ambulatory Visit
Admission: RE | Admit: 2015-03-04 | Discharge: 2015-03-04 | Disposition: A | Discharge status: Home / Self Care | Payer: No Typology Code available for payment source | Source: Ambulatory Visit | Attending: Physician Assistant | Admitting: Physician Assistant

## 2015-03-04 DIAGNOSIS — E010 Iodine-deficiency related diffuse (endemic) goiter: Secondary | ICD-10-CM

## 2015-03-12 ENCOUNTER — Telehealth: Payer: Self-pay | Admitting: Physician Assistant

## 2015-03-12 DIAGNOSIS — R42 Dizziness and giddiness: Secondary | ICD-10-CM

## 2015-03-12 MED ORDER — MECLIZINE HCL 25 MG PO TABS: 25.0000 mg | ORAL_TABLET | Freq: Three times a day (TID) | ORAL | Status: DC | PRN

## 2015-03-12 NOTE — Telephone Encounter (Signed)
Notified of results. Pt opted to try to lower cholesterol with diet. We discussed food choices. He will refer to [place] diet for guidance. Pt has prediabetes -- we discussed diet modifications for this as well to prevent progression. He will return in 3-6 months for repeat lipid panel. If still high, will consider statin therapy.  Pt states he is still feeling dizzy, esp when driving. With other activities, he is usually asymptomatic. Advised he stop driving until etiology determined. I will call in meclizine to use prn. I will refer back to ENT, Dr. Pollyann Kennedy.

## 2015-03-21 ENCOUNTER — Telehealth: Payer: Self-pay | Admitting: Physician Assistant

## 2015-03-21 DIAGNOSIS — R221 Localized swelling, mass and lump, neck: Secondary | ICD-10-CM | POA: Insufficient documentation

## 2015-03-21 NOTE — Telephone Encounter (Signed)
Please call pt and let him know his neck ultrasound showed he has a nodule, likely in his parathyroid. I have placed lab orders to make sure his parathyroid is acting normally. He can come in at his convenience to get drawn. We should probably do a repeat u/s in 1 year to make sure the nodule is stable and has not grown.

## 2015-03-28 NOTE — Telephone Encounter (Signed)
Was pt called and notified of this message?

## 2015-03-28 NOTE — Telephone Encounter (Signed)
Called pt and advised message from [place]. He will come in to get his blood drawn.

## 2015-06-27 MED FILL — ADVAIR 500/50 DISKUS: 500-50 | 30 days supply | Qty: 60 | Fill #4

## 2015-06-27 MED FILL — predniSONE 10 MG TABS: 10 | 30 days supply | Qty: 120 | Fill #7

## 2015-07-08 MED FILL — ?MONTELUKAST SOD 10 MG TAB: 10 | 30 days supply | Qty: 30 | Fill #5

## 2015-07-08 MED FILL — RABEPRAZOLE SOD DR 20 MG TA: 20 | 30 days supply | Qty: 30 | Fill #2

## 2015-07-29 MED FILL — ADVAIR 500/50 DISKUS: 500-50 | 30 days supply | Qty: 60 | Fill #5

## 2015-07-29 MED FILL — predniSONE 10 MG TABS: 10 | 30 days supply | Qty: 120 | Fill #8

## 2015-08-01 MED FILL — AMOX-CLAV 875-125 MG TABLET: 875-125 | 10 days supply | Qty: 20 | Fill #0

## 2015-08-09 MED FILL — FLUTICASONE PROP 50 MCG SPR: 50 | 30 days supply | Qty: 16 | Fill #3

## 2015-08-09 MED FILL — RABEPRAZOLE SOD DR 20 MG TA: 20 | 30 days supply | Qty: 30 | Fill #3

## 2015-08-09 MED FILL — ?MONTELUKAST SOD 10 MG TAB: 10 | 30 days supply | Qty: 30 | Fill #6

## 2015-09-10 MED FILL — ?MONTELUKAST SOD 10 MG TAB: 10 | 30 days supply | Qty: 30 | Fill #0

## 2015-09-10 MED FILL — FLUTICASONE PROP 50 MCG SPR: 50 | 30 days supply | Qty: 16 | Fill #4

## 2015-09-10 MED FILL — predniSONE 10 MG TABS: 10 | 30 days supply | Qty: 60 | Fill #0

## 2015-09-10 MED FILL — ADVAIR 500/50 DISKUS: 500-50 | 30 days supply | Qty: 60 | Fill #6

## 2015-09-10 MED FILL — RABEPRAZOLE SOD DR 20 MG TA: 20 | 30 days supply | Qty: 30 | Fill #4

## 2015-10-08 MED FILL — predniSONE 10 MG TABS: 10 | 30 days supply | Qty: 60 | Fill #1

## 2015-10-08 MED FILL — RABEPRAZOLE SOD DR 20 MG TA: 20 | 30 days supply | Qty: 30 | Fill #5

## 2015-10-08 MED FILL — ?MONTELUKAST SOD 10 MG TAB: 10 | 30 days supply | Qty: 30 | Fill #1

## 2015-10-08 MED FILL — FLUTICASONE PROP 50 MCG SPR: 50 | 30 days supply | Qty: 16 | Fill #5

## 2015-10-22 MED FILL — predniSONE 10 MG TABS: 10 | 30 days supply | Qty: 60 | Fill #0

## 2015-10-22 MED FILL — predniSONE 5 MG TABS: 5 | 30 days supply | Qty: 30 | Fill #0

## 2015-11-12 MED FILL — ?MONTELUKAST SOD 10 MG TAB: 10 | 30 days supply | Qty: 30 | Fill #2

## 2015-11-12 MED FILL — predniSONE 5 MG TABS: 5 | 30 days supply | Qty: 30 | Fill #1

## 2015-11-12 MED FILL — RABEPRAZOLE SOD DR 20 MG TA: 20 | 30 days supply | Qty: 30 | Fill #6

## 2015-11-12 MED FILL — predniSONE 10 MG TABS: 10 | 30 days supply | Qty: 60 | Fill #1

## 2015-12-16 MED FILL — ?MONTELUKAST SOD 10 MG TAB: 10 | 30 days supply | Qty: 30 | Fill #3

## 2015-12-16 MED FILL — predniSONE 10 MG TABS: 10 | 30 days supply | Qty: 60 | Fill #2

## 2015-12-16 MED FILL — RABEPRAZOLE SOD DR 20 MG TA: 20 | 30 days supply | Qty: 30 | Fill #7

## 2015-12-16 MED FILL — predniSONE 5 MG TABS: 5 | 30 days supply | Qty: 30 | Fill #2

## 2016-01-06 MED FILL — predniSONE 5 MG TABS: 5 | 30 days supply | Qty: 30 | Fill #3

## 2016-01-06 MED FILL — RABEPRAZOLE SOD DR 20 MG TA: 20 | 30 days supply | Qty: 30 | Fill #0

## 2016-01-07 MED FILL — ?MONTELUKAST SOD 10 MG TAB: 10 | 90 days supply | Qty: 90 | Fill #0

## 2016-01-10 MED FILL — predniSONE 10 MG TABS: 10 | 30 days supply | Qty: 60 | Fill #3

## 2016-01-18 ENCOUNTER — Encounter: Payer: Self-pay | Admitting: Physician Assistant

## 2016-01-18 ENCOUNTER — Ambulatory Visit (INDEPENDENT_AMBULATORY_CARE_PROVIDER_SITE_OTHER): Payer: Self-pay | Admitting: Physician Assistant

## 2016-01-18 VITALS — BP 122/72 | HR 80 | Temp 98.6°F | Resp 17 | Ht 68.0 in | Wt 235.0 lb

## 2016-01-18 DIAGNOSIS — Z202 Contact with and (suspected) exposure to infections with a predominantly sexual mode of transmission: Secondary | ICD-10-CM

## 2016-01-18 DIAGNOSIS — R300 Dysuria: Secondary | ICD-10-CM

## 2016-01-18 LAB — POCT URINALYSIS DIP (MANUAL ENTRY)
Bilirubin, UA: NEGATIVE
Glucose, UA: NEGATIVE
Ketones, POC UA: NEGATIVE
Leukocytes, UA: NEGATIVE
Nitrite, UA: NEGATIVE
Protein Ur, POC: NEGATIVE
Spec Grav, UA: 1.015
Urobilinogen, UA: 0.2
pH, UA: 6.5

## 2016-01-18 LAB — POC MICROSCOPIC URINALYSIS (UMFC): Mucus: ABSENT

## 2016-01-18 NOTE — Patient Instructions (Addendum)
Urinate in the cup provided when you first wake up.  Please bring it to [facility]. We will send it to the lab and your results should be available 48 hours later.   Thank you for coming in today. I hope you feel we met your needs.  Feel free to call [facility] if you have any questions or further requests.  Please consider signing up for MyChart if you do not already have it, as this is a great way to communicate with me.  Best,  Whitney McVey, PA-C    IF you received an x-ray today, you will receive an invoice from [facility]. Please contact [facility] at [phone] with questions or concerns regarding your invoice.   IF you received labwork today, you will receive an invoice from [employer]. Please contact [facility] at [phone] with questions or concerns regarding your invoice.   Our billing staff will not be able to assist you with questions regarding bills from these companies.  You will be contacted with the lab results as soon as they are available. The fastest way to get your results is to activate your My Chart account. Instructions are located on the last page of this paperwork. If you have not heard from [place] regarding the results in 2 weeks, please contact this office.

## 2016-01-18 NOTE — Progress Notes (Signed)
Mike Beck  MRN: [id number] DOB: 08/21/1975  PCP: Jeanann Lewandowsky, MD  Subjective:  Pt presents to clinic for stomach pain x 4 months. He came in today because he is tired of the belly pain, not because symptoms are worsening. His pain is located in his lower belly, describes as intermittent and dull, and occasionally radiates to testicles and penis.   Denies testicle swelling. + occasional pain, relieved with testicle elevation. + Urinary symptoms. Burns once or twice a month. Says urine is brownish. No difficulty urinating. No increased frequency or blood in urine.  No discharge noted from penis.  Denies fevers, chills, n/v/d, joint pain.    Says he has had new sexual partners in the last 6 months, always uses condoms.   Appliance delivery driver. Lifts heavy loads. Pain does not affect ability to work. Drinks 50-60 oz water a day.    Review of Systems  Constitutional: Negative.   Respiratory: Negative for cough, chest tightness and shortness of breath.   Cardiovascular: Negative for chest pain and palpitations.  Gastrointestinal: Positive for abdominal pain (mild in lower belly ). Negative for constipation, diarrhea, nausea and vomiting.  Genitourinary: Positive for dysuria (occasional x 4 months) and testicular pain. Negative for decreased urine volume, difficulty urinating, discharge, enuresis, flank pain, frequency, genital sores, hematuria, penile pain, penile swelling, scrotal swelling and urgency.  Musculoskeletal: Negative for arthralgias and back pain.  Skin: Negative for color change and rash.  Neurological: Negative for dizziness and headaches.    Patient Active Problem List   Diagnosis Date Noted  . Neck nodule 03/21/2015  . Neck nodule 03/21/2015  . On prednisone therapy 02/28/2015  . Multiple nasal polyps 02/14/2013  . OSA (obstructive sleep apnea) 06/07/2012  . Allergic rhinitis due to pollen 08/04/2010  . Sinusitis, chronic 08/27/2009  . HYPERLIPIDEMIA  06/06/2009  . EOSINOPHILIA 06/06/2009  . Asthma, allergic 05/21/2009    Current Outpatient Prescriptions on File Prior to Visit  Medication Sig Dispense Refill  . albuterol-ipratropium (COMBIVENT) 18-103 MCG/ACT inhaler Inhale 1-2 puffs into the lungs every 6 (six) hours as needed for wheezing or shortness of breath.     . Ascorbic Acid (VITAMIN C PO) Take 1 tablet by mouth daily.    . fluticasone (FLONASE) 50 MCG/ACT nasal spray Place 2 sprays into the nose daily. 16 g 3  . Fluticasone-Salmeterol (ADVAIR) 500-50 MCG/DOSE AEPB Inhale 1 puff into the lungs 2 (two) times daily. 180 each 3  . loratadine (CLARITIN) 10 MG tablet Take 1 tablet (10 mg total) by mouth daily. 90 tablet 0  . meclizine (ANTIVERT) 25 MG tablet Take 1 tablet (25 mg total) by mouth 3 (three) times daily as needed for dizziness. 30 tablet 0  . montelukast (SINGULAIR) 10 MG tablet TOME 1 TABLETA POR LA BOCA CADA DIA 30 tablet 3  . Omega-3 Fatty Acids (FISH OIL PO) Take 1 capsule by mouth daily.    . predniSONE (DELTASONE) 5 MG tablet Take 5 mg by mouth daily with breakfast.     . RABEprazole (ACIPHEX) 20 MG tablet Take 1 tablet (20 mg total) by mouth daily. 30 tablet 3  . albuterol (PROVENTIL) (2.5 MG/3ML) 0.083% nebulizer solution Take 2.5 mg by nebulization every 6 (six) hours as needed.    . [DISCONTINUED] levocetirizine (XYZAL) 5 MG tablet Take 5 mg by mouth every evening.       No current facility-administered medications on file prior to visit.     No Known Allergies  Objective:  BP  122/72 (BP Location: Right Arm, Patient Position: Sitting, Cuff Size: Normal)   Pulse 80   Temp 98.6 F (37 C) (Oral)   Resp 17   Ht 5\' 8"  (1.727 m)   Wt 235 lb (106.6 kg)   SpO2 97%   BMI 35.73 kg/m   Physical Exam  Constitutional: He is oriented to person, place, and time and well-developed, well-nourished, and in no distress. No distress.  HENT:  Head: Normocephalic and atraumatic.  Eyes: Conjunctivae are normal. Pupils  are equal, round, and reactive to light. Right eye exhibits no discharge. Left eye exhibits no discharge.  Cardiovascular: Normal rate, regular rhythm and normal heart sounds.   Pulmonary/Chest: Effort normal and breath sounds normal.  Abdominal: Soft. Bowel sounds are normal. He exhibits no mass. There is no tenderness (No TTP lower belly. ). There is no guarding.  Genitourinary: Testes/scrotum normal and penis normal. He exhibits no abnormal testicular mass, no testicular tenderness, no abnormal scrotal mass, no scrotal tenderness and no epididymal tenderness. Penis exhibits no lesions and no edema. No discharge found.  Genitourinary Comments: No sign of inflammation, erythema or discharge from meatus of penis.  L scrotum larger than right, normal for patient.   Neurological: He is alert and oriented to person, place, and time. GCS score is 15.  Skin: Skin is warm and dry. He is not diaphoretic.  Vitals reviewed.  Results for orders placed or performed in visit on 01/18/16  POCT urinalysis dipstick  Result Value Ref Range   Color, UA yellow yellow   Clarity, UA clear clear   Glucose, UA negative negative   Bilirubin, UA negative negative   Ketones, POC UA negative negative   Spec Grav, UA 1.015    Blood, UA trace-intact (A) negative   pH, UA 6.5    Protein Ur, POC negative negative   Urobilinogen, UA 0.2    Nitrite, UA Negative Negative   Leukocytes, UA Negative Negative  POCT Microscopic Urinalysis ([facility])  Result Value Ref Range   WBC,UR,HPF,POC None None WBC/hpf   RBC,UR,HPF,POC None None RBC/hpf   Bacteria None None, Too numerous to count   Mucus Absent Absent   Epithelial Cells, UR Per Microscopy None None, Too numerous to count cells/hpf     Assessment and Plan :  1. Dysuria - POCT urinalysis dipstick - niegative - POCT Microscopic Urinalysis ([facility]) - negative  2. Possible exposure to STD - Patient will take urine cup home, collect his first void of the morning and  [facility]  - GC/Chlamydia Probe Amp - RPR - HIV antibody - Trichomonas vaginalis, RNA   Marco CollieWhitney Keylen Eckenrode, PA-C  [facility] 01/18/2016 11:24 AM

## 2016-01-19 LAB — HIV ANTIBODY (ROUTINE TESTING W REFLEX): HIV 1&2 Ab, 4th Generation: NONREACTIVE

## 2016-01-20 ENCOUNTER — Encounter: Payer: Self-pay | Admitting: Physician Assistant

## 2016-01-21 LAB — GC/CHLAMYDIA PROBE AMP
CT Probe RNA: NOT DETECTED
GC Probe RNA: NOT DETECTED

## 2016-01-21 LAB — RPR

## 2016-01-22 LAB — TRICHOMONAS VAGINALIS, PROBE AMP: T vaginalis RNA: NOT DETECTED

## 2016-01-28 NOTE — Progress Notes (Signed)
Please let patient know all of his STD labs are negative. Please return to clinic if his abdominal pain continues.

## 2016-02-03 MED FILL — predniSONE 5 MG TABS: 5 | 30 days supply | Qty: 30 | Fill #4

## 2016-02-03 MED FILL — predniSONE 10 MG TABS: 10 | 30 days supply | Qty: 60 | Fill #4

## 2016-02-18 MED FILL — RABEPRAZOLE SOD DR 20 MG TA: 20 | 30 days supply | Qty: 30 | Fill #1

## 2016-02-27 MED FILL — FLUTICASONE PROP 50 MCG SPR: 50 | 20 days supply | Qty: 16 | Fill #0

## 2016-02-27 MED FILL — ?PREDNISONE 10 MG TABLET: 10 | 40 days supply | Qty: 100 | Fill #0

## 2016-02-27 MED FILL — **ADVAIR 500/50 DISKUS: 500-50 MCG | 7 days supply | Qty: 14 | Fill #0

## 2016-02-27 MED FILL — QVAR 80 MCG ORAL INHALER: 80 | 20 days supply | Qty: 9 | Fill #0

## 2016-02-27 MED FILL — VENTOLIN HFA 90 MCG INHALER: 108 (90 BAS | 20 days supply | Qty: 18 | Fill #0

## 2016-03-26 MED FILL — ?PREDNISONE 10 MG TABLET: 10 | 40 days supply | Qty: 100 | Fill #1

## 2016-03-26 MED FILL — **ADVAIR 500/50 DISKUS: 500-50 MCG | 7 days supply | Qty: 14 | Fill #1

## 2016-03-26 MED FILL — FLUTICASONE PROP 50 MCG SPR: 50 | 20 days supply | Qty: 16 | Fill #1

## 2016-03-26 MED FILL — RABEPRAZOLE SOD DR 20 MG TA: 20 | 30 days supply | Qty: 30 | Fill #2

## 2016-03-27 MED FILL — VENTOLIN HFA 90 MCG INHALER: 108 (90 BAS | 20 days supply | Qty: 18 | Fill #1

## 2016-04-22 MED FILL — RABEPRAZOLE SOD DR 20 MG TA: 20 | 30 days supply | Qty: 30 | Fill #3

## 2016-04-22 MED FILL — ?MONTELUKAST SOD 10 MG TAB: 10 MG | 90 days supply | Qty: 90 | Fill #1

## 2016-04-22 MED FILL — ?PREDNISONE 10 MG TABLET: 10 | 40 days supply | Qty: 100 | Fill #2

## 2016-05-20 MED FILL — VENTOLIN HFA 90 MCG INHALER: 108 (90 BAS | 20 days supply | Qty: 18 | Fill #2

## 2016-05-20 MED FILL — ?PREDNISONE 10 MG TABLET: 10 | 30 days supply | Qty: 75 | Fill #0

## 2016-06-09 MED FILL — RABEPRAZOLE SOD DR 20 MG TA: 20 | 30 days supply | Qty: 30 | Fill #0

## 2016-06-10 MED FILL — ?PREDNISONE 10 MG TABLET: 10 | 40 days supply | Qty: 100 | Fill #3

## 2016-07-08 MED FILL — VENTOLIN HFA 90 MCG INHALER: 108 (90 BAS | 25 days supply | Qty: 18 | Fill #3

## 2016-07-08 MED FILL — RABEPRAZOLE SOD DR 20 MG TA: 20 | 30 days supply | Qty: 30 | Fill #1

## 2016-07-08 MED FILL — ?PREDNISONE 10 MG TABLET: 10 | 40 days supply | Qty: 100 | Fill #4

## 2016-07-22 MED FILL — MONTELUKAST SOD 10 MG TAB: 10 | 90 days supply | Qty: 90 | Fill #2

## 2016-08-05 MED FILL — !ADVAIR 500/50 DISKUS: 500-50 | 30 days supply | Qty: 60 | Fill #2

## 2016-08-05 MED FILL — ?PREDNISONE 10 MG TABLET: 10 | 40 days supply | Qty: 100 | Fill #5

## 2016-08-11 MED FILL — RABEPRAZOLE SOD DR 20 MG TA: 20 | 30 days supply | Qty: 30 | Fill #2

## 2016-08-17 MED FILL — ?PREDNISONE 10 MG TABLET: 10 | 50 days supply | Qty: 150 | Fill #0

## 2016-09-22 MED FILL — VENTOLIN HFA 90 MCG INHALER: 108 (90 BAS | 25 days supply | Qty: 18 | Fill #4

## 2016-09-22 MED FILL — FLUTICASONE PROP 50 MCG SPR: 50 | 15 days supply | Qty: 16 | Fill #2

## 2016-09-22 MED FILL — RABEPRAZOLE SOD DR 20 MG TA: 20 | 30 days supply | Qty: 30 | Fill #3

## 2016-10-05 ENCOUNTER — Other Ambulatory Visit: Payer: Self-pay | Admitting: *Deleted

## 2016-10-05 DIAGNOSIS — J339 Nasal polyp, unspecified: Secondary | ICD-10-CM

## 2016-10-05 DIAGNOSIS — J45909 Unspecified asthma, uncomplicated: Secondary | ICD-10-CM

## 2016-10-05 MED ORDER — FLUTICASONE-SALMETEROL 500-50 MCG/DOSE IN AEPB: 1.0000 | INHALATION_SPRAY | Freq: Two times a day (BID) | RESPIRATORY_TRACT | 3 refills | Status: DC

## 2016-10-05 NOTE — Telephone Encounter (Signed)
PRINTED FOR PASS PROGRAM 

## 2016-10-15 ENCOUNTER — Other Ambulatory Visit: Payer: Self-pay | Admitting: *Deleted

## 2016-10-15 DIAGNOSIS — J45909 Unspecified asthma, uncomplicated: Secondary | ICD-10-CM

## 2016-10-15 DIAGNOSIS — J339 Nasal polyp, unspecified: Secondary | ICD-10-CM

## 2016-10-15 MED ORDER — FLUTICASONE-SALMETEROL 230-21 MCG/ACT IN AERO: 2.0000 | INHALATION_SPRAY | Freq: Two times a day (BID) | RESPIRATORY_TRACT | 3 refills | Status: DC

## 2016-10-15 MED FILL — predniSONE 10 MG TABS: 10 | 50 days supply | Qty: 150 | Fill #1

## 2016-10-15 MED FILL — MONTELUKAST SOD 10 MG TAB: 10 | 90 days supply | Qty: 90 | Fill #3

## 2016-10-15 NOTE — Telephone Encounter (Signed)
PRINTED FOR PASS PROGRAM 

## 2016-10-26 ENCOUNTER — Other Ambulatory Visit: Payer: Self-pay | Admitting: *Deleted

## 2016-10-26 MED ORDER — FLUTICASONE-SALMETEROL 230-21 MCG/ACT IN AERO: 2.0000 | INHALATION_SPRAY | Freq: Two times a day (BID) | RESPIRATORY_TRACT | 12 refills | Status: AC

## 2016-10-26 NOTE — Telephone Encounter (Signed)
Patient ineligible for PASS. Advair alternative sent.

## 2016-10-27 MED FILL — **ADVAIR HFA 230-21 MCG INH: 230-21 MCG | 30 days supply | Qty: 16 | Fill #0

## 2016-10-27 MED FILL — RABEPRAZOLE SOD DR 20 MG TA: 20 | 30 days supply | Qty: 30 | Fill #4

## 2016-12-01 MED FILL — RABEPRAZOLE SOD DR 20 MG TA: 20 | 30 days supply | Qty: 30 | Fill #5

## 2016-12-01 MED FILL — **ADVAIR HFA 230-21 MCG INH: 230-21 MCG | 30 days supply | Qty: 16 | Fill #0

## 2016-12-01 MED FILL — FLUTICASONE PROP 50 MCG SPR: 50 | 15 days supply | Qty: 16 | Fill #3

## 2016-12-10 MED FILL — ?PREDNISONE 10 MG TABLET: 10 | 50 days supply | Qty: 150 | Fill #2

## 2017-01-04 MED FILL — RABEPRAZOLE SOD DR 20 MG TA: 20 | 30 days supply | Qty: 30 | Fill #6

## 2017-01-04 MED FILL — VENTOLIN HFA 90 MCG INHALER: 108 (90 BAS | 25 days supply | Qty: 18 | Fill #5

## 2017-01-26 MED FILL — ?PREDNISONE 10 MG TABLET: 10 | 50 days supply | Qty: 150 | Fill #3

## 2017-02-01 MED FILL — ?MONTELUKAST SOD 10MG TAB: 10 | 30 days supply | Qty: 30 | Fill #0

## 2017-02-09 MED FILL — !VENTOLIN HFA INHALER: 108 (90 BAS | 25 days supply | Qty: 18 | Fill #6

## 2017-02-09 MED FILL — FLUTICASONE PROP 50 MCG SPR: 50 | 15 days supply | Qty: 16 | Fill #4

## 2017-02-09 MED FILL — **ADVAIR HFA 230-21 MCG INH: 230-21 MCG | 30 days supply | Qty: 16 | Fill #1

## 2017-02-09 MED FILL — RABEPRAZOLE SOD DR 20 MG TA: 20 | 30 days supply | Qty: 30 | Fill #7

## 2017-03-11 MED FILL — RABEPRAZOLE SOD DR 20 MG TA: 20 | 30 days supply | Qty: 30 | Fill #8

## 2017-03-11 MED FILL — ?MONTELUKAST SOD 10MG TAB: 10 | 30 days supply | Qty: 30 | Fill #1

## 2017-03-11 MED FILL — predniSONE 10 MG TABS: 10 | 50 days supply | Qty: 150 | Fill #4

## 2017-04-13 MED FILL — ?MONTELUKAST SOD 10MG TAB: 10 | 30 days supply | Qty: 30 | Fill #2

## 2017-04-13 MED FILL — RABEPRAZOLE SOD DR 20 MG TA: 20 | 30 days supply | Qty: 30 | Fill #9

## 2017-05-12 MED FILL — ?PREDNISONE 10 MG TABLET: 10 | 30 days supply | Qty: 90 | Fill #5

## 2017-05-26 MED FILL — !ADVAIR 500/50 DISKUS: 500-50 | 30 days supply | Qty: 60 | Fill #0

## 2017-05-26 MED FILL — ?MONTELUKAST SOD 10MG TAB: 10 | 30 days supply | Qty: 30 | Fill #3

## 2017-05-26 MED FILL — !VENTOLIN HFA INHALER: 108 (90 BAS | 25 days supply | Qty: 18 | Fill #0

## 2017-05-26 MED FILL — RABEPRAZOLE SOD DR 20 MG TA: 20 | 30 days supply | Qty: 30 | Fill #10

## 2017-06-23 MED FILL — ?MONTELUKAST SOD 10 MG TAB: 10 | 30 days supply | Qty: 30 | Fill #4

## 2017-06-23 MED FILL — ?PREDNISONE 10 MG TABLET: 10 | 30 days supply | Qty: 90 | Fill #6

## 2017-06-23 MED FILL — RABEPRAZOLE SOD DR 20 MG TA: 20 | 30 days supply | Qty: 30 | Fill #0

## 2017-07-28 MED FILL — ?MONTELUKAST SOD 10 MG TAB: 10 | 30 days supply | Qty: 30 | Fill #5

## 2017-07-28 MED FILL — predniSONE 10 MG TABS: 10 | 30 days supply | Qty: 90 | Fill #7

## 2018-02-17 ENCOUNTER — Other Ambulatory Visit: Payer: Self-pay

## 2018-02-17 ENCOUNTER — Emergency Department (HOSPITAL_COMMUNITY)
Admission: EM | Admit: 2018-02-17 | Discharge: 2018-02-18 | Disposition: A | Discharge status: Home / Self Care | Payer: Self-pay | Attending: Emergency Medicine | Admitting: Emergency Medicine

## 2018-02-17 ENCOUNTER — Encounter (HOSPITAL_COMMUNITY): Payer: Self-pay

## 2018-02-17 ENCOUNTER — Emergency Department (HOSPITAL_COMMUNITY): Payer: Self-pay

## 2018-02-17 DIAGNOSIS — Z87891 Personal history of nicotine dependence: Secondary | ICD-10-CM | POA: Insufficient documentation

## 2018-02-17 DIAGNOSIS — R42 Dizziness and giddiness: Secondary | ICD-10-CM | POA: Insufficient documentation

## 2018-02-17 DIAGNOSIS — R55 Syncope and collapse: Secondary | ICD-10-CM

## 2018-02-17 DIAGNOSIS — J4541 Moderate persistent asthma with (acute) exacerbation: Secondary | ICD-10-CM | POA: Insufficient documentation

## 2018-02-17 DIAGNOSIS — Z79899 Other long term (current) drug therapy: Secondary | ICD-10-CM | POA: Insufficient documentation

## 2018-02-17 LAB — BASIC METABOLIC PANEL
ANION GAP: 11 (ref 5–15)
BUN: 14 mg/dL (ref 6–20)
CALCIUM: 9.2 mg/dL (ref 8.9–10.3)
CHLORIDE: 107 mmol/L (ref 98–111)
CO2: 23 mmol/L (ref 22–32)
CREATININE: 1.13 mg/dL (ref 0.61–1.24)
GFR calc non Af Amer: >60 mL/min (ref >60)
Glucose, Bld: 170 mg/dL — ABNORMAL HIGH (ref 70–99)
Potassium: 4.3 mmol/L (ref 3.5–5.1)
SODIUM: 141 mmol/L (ref 135–145)

## 2018-02-17 LAB — CBC
HCT: 51.3 % (ref 39.0–52.0)
HEMOGLOBIN: 16.3 g/dL (ref 13.0–17.0)
MCH: 30.6 pg (ref 26.0–34.0)
MCHC: 31.8 g/dL (ref 30.0–36.0)
MCV: 96.4 fL (ref 78.0–100.0)
PLATELETS: 217 10*3/uL (ref 150–400)
RBC: 5.32 MIL/uL (ref 4.22–5.81)
RDW: 13.2 % (ref 11.5–15.5)
WBC: 14.9 10*3/uL — AB (ref 4.0–10.5)

## 2018-02-17 LAB — I-STAT TROPONIN, ED: TROPONIN I, POC: 0.02 ng/mL (ref 0.00–0.08)

## 2018-02-17 MED ORDER — ALBUTEROL SULFATE (2.5 MG/3ML) 0.083% IN NEBU
5.0000 mg | INHALATION_SOLUTION | Freq: Once | RESPIRATORY_TRACT | Status: AC
  Administered 2018-02-17: 5 mg via RESPIRATORY_TRACT
  Filled 2018-02-17: qty 6

## 2018-02-17 MED ORDER — IPRATROPIUM BROMIDE 0.02 % IN SOLN
0.5000 mg | Freq: Once | RESPIRATORY_TRACT | Status: AC
  Administered 2018-02-17: 0.5 mg via RESPIRATORY_TRACT
  Filled 2018-02-17: qty 2.5

## 2018-02-17 MED ORDER — SODIUM CHLORIDE 0.9 % IV BOLUS
1000.0000 mL | Freq: Once | INTRAVENOUS | Status: AC
  Administered 2018-02-17: 1000 mL via INTRAVENOUS

## 2018-02-17 NOTE — ED Provider Notes (Addendum)
[facility] EMERGENCY DEPARTMENT Provider Note   CSN: [id number] Arrival date & time: 02/17/18  1716     History   Chief Complaint Chief Complaint  Patient presents with  . Shortness of Breath    HPI Mike Beck is a 42 y.o. male.  The history is provided by the patient. No language interpreter was used.  Shortness of Breath      42 year old male with history of asthma, seasonal allergies, remote history of pneumonia presenting for evaluation of shortness of breath.  Patient report having increased shortness of breath for the past 2 to 3 days.  States he is wheezing, he has been using his Ventolin more than usual as well as increasing his steroid dose at home to combat the shortness of breath.  He also complained of new sensation of lightheadedness which is been ongoing for over a week.  Lightheadedness usually brought on by movement such as sitting up standing up or exertion.  He described as feeling like he is going to passed out and feeling drunk.  He denies any associated fever, URI symptoms, nausea, vomiting, exertional chest pain, productive cough, dysuria or rash.  He is a former smoker has quit 10 years ago.  He does work outside but he tries to stay hydrated.  He felt that the weather change may have worsened his symptoms.  No prior history of PE or DVT.  Past Medical History:  Diagnosis Date  . Allergic rhinitis    uses Flonase daily  . Allergy   . Asthma    uses Albuterol and Combivent prn;Advair daily and Prednisone daily  . GERD (gastroesophageal reflux disease)    takes Aciphex daily  . Headache(784.0)    at times  . Hyperlipidemia   . Hyperlipidemia    was on Niacin but stopped 2wks ago per MD  . Insomnia    sometimes but no meds required  . Joint pain    knees and in the winter  . Numbness    sometimes in both arms  . Pneumonia    hx of;93yrs ago    Patient Active Problem List   Diagnosis Date Noted  . Neck nodule 03/21/2015  . On  prednisone therapy 02/28/2015  . Multiple nasal polyps 02/14/2013  . OSA (obstructive sleep apnea) 06/07/2012  . Allergic rhinitis due to pollen 08/04/2010  . Sinusitis, chronic 08/27/2009  . HYPERLIPIDEMIA 06/06/2009  . EOSINOPHILIA 06/06/2009  . Asthma, allergic 05/21/2009    Past Surgical History:  Procedure Laterality Date  . NASAL SINUS SURGERY Bilateral 04/26/2013   Procedure: ENDOSCOPIC ETHMOID AND MAXILLARY SINUS SURGERY/BILATERAL POLYPECTOMY;  Surgeon: Serena Colonel, MD;  Location: [facility] OR;  Service: ENT;  Laterality: Bilateral;  . none    . polyps surgery          Home Medications    Prior to Admission medications   Medication Sig Start Date End Date Taking? Authorizing Provider  albuterol (PROVENTIL) (2.5 MG/3ML) 0.083% nebulizer solution Take 2.5 mg by nebulization every 6 (six) hours as needed. 12/11/10 02/02/13  Oretha Milch, MD  albuterol-ipratropium (COMBIVENT) 18-103 MCG/ACT inhaler Inhale 1-2 puffs into the lungs every 6 (six) hours as needed for wheezing or shortness of breath.     [provider]  Ascorbic Acid (VITAMIN C PO) Take 1 tablet by mouth daily.    [provider]  fluticasone (FLONASE) 50 MCG/ACT nasal spray Place 2 sprays into the nose daily. 02/14/13   Quentin Angst, MD  fluticasone-salmeterol (  ADVAIR HFA) 230-21 MCG/ACT inhaler Inhale 2 puffs into the lungs 2 (two) times daily. 10/26/16   Quentin Angst, MD  loratadine (CLARITIN) 10 MG tablet Take 1 tablet (10 mg total) by mouth daily. 10/06/13   Chambliss, Estill Batten, MD  montelukast (SINGULAIR) 10 MG tablet TOME 1 TABLETA POR LA BOCA CADA DIA    Jegede, Olugbemiga E, MD  predniSONE (DELTASONE) 5 MG tablet Take 5 mg by mouth daily with breakfast.     [provider]  RABEprazole (ACIPHEX) 20 MG tablet Take 1 tablet (20 mg total) by mouth daily. 03/26/11   Parrett, Virgel Bouquet, NP  levocetirizine (XYZAL) 5 MG tablet Take 5 mg by mouth every evening.    08/25/11  [provider]    Family History Family History  Problem Relation Age of Onset  . Diabetes Father   . Diabetes Mother     Social History Social History   Tobacco Use  . Smoking status: Former Smoker    Packs/day: 0.50    Years: 20.00    Pack years: 10.00    Types: Cigarettes    Last attempt to quit: 12/06/2008    Years since quitting: 9.2  . Smokeless tobacco: Never Used  Substance Use Topics  . Alcohol use: No    Alcohol/week: 0.0 standard drinks    Comment: social  . Drug use: No     Allergies   Patient has no known allergies.   Review of Systems Review of Systems  Respiratory: Positive for shortness of breath.   Neurological: Positive for light-headedness.  All other systems reviewed and are negative.    Physical Exam Updated Vital Signs BP 123/82   Pulse 95   Temp 98.5 F (36.9 C) (Oral)   Resp 18   Ht 5\' 8"  (1.727 m)   Wt 106.6 kg   SpO2 97%   BMI 35.73 kg/m   Physical Exam  Constitutional: He appears well-developed and well-nourished. No distress.  HENT:  Head: Atraumatic.  Eyes: Conjunctivae are normal.  Neck: Neck supple.  Cardiovascular: Normal rate and regular rhythm.  Pulmonary/Chest: Effort normal. He has wheezes. He has no rhonchi. He has no rales.  Musculoskeletal: Normal range of motion.       Right lower leg: He exhibits no edema.       Left lower leg: He exhibits no edema.  Neurological: He is alert.  Skin: No rash noted.  Psychiatric: He has a normal mood and affect.  Nursing note and vitals reviewed.    ED Treatments / Results  Labs (all labs ordered are listed, but only abnormal results are displayed) Labs Reviewed  BASIC METABOLIC PANEL - Abnormal; Notable for the following components:      Result Value   Glucose, Bld 170 (*)    All other components within normal limits  CBC - Abnormal; Notable for the following components:   WBC 14.9 (*)    All other components within normal limits  I-STAT TROPONIN, ED     EKG None   Date: 02/17/2018  Rate: 94  Rhythm: normal sinus rhythm  QRS Axis: normal  Intervals: normal  ST/T Wave abnormalities: normal  Conduction Disutrbances: none  Narrative Interpretation:   Old EKG Reviewed: No significant changes noted     Radiology Dg Chest 2 View  Result Date: 02/17/2018 CLINICAL DATA:  Asthma, 1 week history of dyspnea, wheezing and productive cough with green sputum EXAM: CHEST - 2 VIEW COMPARISON:  05/22/2014 FINDINGS: Normal heart  size, mediastinal contours, and pulmonary vascularity. Lungs clear. No pulmonary infiltrate, pleural effusion, or pneumothorax. Bones unremarkable. IMPRESSION: No acute abnormalities. Electronically Signed   By: Ulyses SouthwardMark  Boles M.D.   On: 02/17/2018 19:15    Procedures Procedures (including critical care time)  Medications Ordered in ED Medications  albuterol (PROVENTIL) (2.5 MG/3ML) 0.083% nebulizer solution 5 mg (5 mg Nebulization Given 02/17/18 1736)  sodium chloride 0.9 % bolus 1,000 mL (1,000 mLs Intravenous New Bag/Given 02/17/18 2316)  albuterol (PROVENTIL) (2.5 MG/3ML) 0.083% nebulizer solution 5 mg (5 mg Nebulization Given 02/17/18 2319)  ipratropium (ATROVENT) nebulizer solution 0.5 mg (0.5 mg Nebulization Given 02/17/18 2320)     Initial Impression / Assessment and Plan / ED Course  I have reviewed the triage vital signs and the nursing notes.  Pertinent labs & imaging results that were available during my care of the patient were reviewed by me and considered in my medical decision making (see chart for details).     BP (!) 125/9 (BP Location: Right Arm)   Pulse 90   Temp 98.4 F (36.9 C) (Oral)   Resp 16   Ht 5\' 8"  (1.727 m)   Wt 106.6 kg   SpO2 99%   BMI 35.73 kg/m    Final Clinical Impressions(s) / ED Diagnoses   Final diagnoses:  Moderate persistent asthma with exacerbation  Near syncope    ED Discharge Orders         Ordered    benzonatate (TESSALON) 100 MG capsule  Every 8 hours      02/18/18 0041         11:07 PM Patient here with shortness of breath and wheezing.  History of asthma.  He is actively wheezing and will benefit from additional DuoNeb's.  He also complaining of lightheadedness and dizziness.  Will check orthostatic vital sign, IV fluid given.  Labs remarkable for elevated white blood count of 14.9 however I suspect this is likely due to chronic steroid use.  Chest x-ray without acute abnormalities.  EKG unremarkable. PERC negative, doubt PE.  Low suspicion for ACS  12:31 AM No hypoxia with ambulation.  Evidence of mild orthostatic hypotension.  Patient did receive IV fluid in the ED.  Encourage patient to increase his steroid use to 60mg  tapering and follow-up with PCP for further care. Pt voice understanding and agrees with plan.  Lung sounds improves on reexamination. Return precaution given.       Fayrene Helperran, Padme Arriaga, PA-C 02/18/18 Venita [id number]    Isaacs, Cameron, MD 02/20/18 [phone]

## 2018-02-17 NOTE — Discharge Instructions (Addendum)
Please increase steroid dose to 60mg  tomorrow, 50mg  the next day, 40mg  the day after that, and then 30mg .  Take tessalon as needed for cough. Call and follow up with your primary care provider for further management.  Stay hydrated by drinking plenty of water as it will help you with your light headedness.  Return if you have any concerns.

## 2018-02-17 NOTE — ED Triage Notes (Signed)
Pt states that he takes prednisone everyday but has been feeling SOB X1 week. Pt has card with him in triage stating that he is in a corticosteroid trial/experiment.

## 2018-02-17 NOTE — ED Notes (Signed)
APP at bedside 

## 2018-02-17 NOTE — ED Provider Notes (Signed)
MSE was initiated and I personally evaluated the patient  5:28 PM on February 17, 2018.  Patient placed in Quick Look pathway, seen and evaluated   Chief Complaint: dyspnea  HPI:   Patient is a 42 yo male hx of asthma currently participating in clinical trial with [facility] receiving Tezepelumab 210 mg every 4 weeks, on daily 30 mg daily prednisone presents with 1 week of dyspnea, wheezing, and productive cough with green mucous sputum. Using inhaler with minimal relief.   ROS: + congestion, sore throat Physical Exam:   Gen: No distress  Neuro: Awake and Alert  Skin: Warm    Focused Exam: Heart: RRR. Lungs: Diffuse expiratory wheezing throughout.    Initiation of care has begun. The patient has been counseled on the process, plan, and necessity for staying for the completion/evaluation, and the remainder of the medical screening examination. I discussed with patient  and if present family/friends the importance of alerting staff to any new or worsening symptoms or any other concerns throughout his/her ER visit.      Desmond Lopeetrucelli, Justyne Roell R, PA-C 02/17/18 1731    Benjiman CorePickering, Nathan, MD 02/18/18 [phone]

## 2018-02-18 MED ORDER — BENZONATATE 100 MG PO CAPS: 100.0000 mg | ORAL_CAPSULE | Freq: Three times a day (TID) | ORAL | 0 refills | Status: AC

## 2018-02-18 NOTE — ED Notes (Signed)
Pt O2 sat 96% while ambulating.
